# Patient Record
Sex: Male | Born: 2000 | Race: White | Hispanic: No | Marital: Single
Health system: Midwestern US, Community
[De-identification: ages and names within clinical notes are randomized; demographics above are authoritative.]

## PROBLEM LIST (undated history)

## (undated) DIAGNOSIS — J939 Pneumothorax, unspecified: Secondary | ICD-10-CM

---
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
## 2020-10-23 ENCOUNTER — Other Ambulatory Visit: Payer: Self-pay

## 2020-10-23 ENCOUNTER — Emergency Department (HOSPITAL_COMMUNITY)
Admission: EM | Admit: 2020-10-23 | Discharge: 2020-10-23 | Disposition: A | Discharge status: Home / Self Care | Payer: BC Managed Care – PPO | Attending: Emergency Medicine | Admitting: Emergency Medicine

## 2020-10-23 ENCOUNTER — Encounter (HOSPITAL_COMMUNITY): Payer: Self-pay | Admitting: Emergency Medicine

## 2020-10-23 ENCOUNTER — Emergency Department (HOSPITAL_COMMUNITY): Payer: BC Managed Care – PPO

## 2020-10-23 DIAGNOSIS — M25571 Pain in right ankle and joints of right foot: Secondary | ICD-10-CM | POA: Insufficient documentation

## 2020-10-23 MED ORDER — OXYCODONE-ACETAMINOPHEN 5-325 MG PO TABS
1.0000 | ORAL_TABLET | Freq: Once | ORAL | Status: AC
  Administered 2020-10-23: 1 via ORAL
  Filled 2020-10-23: qty 1

## 2020-10-23 MED ORDER — IBUPROFEN 400 MG PO TABS
600.0000 mg | ORAL_TABLET | Freq: Once | ORAL | Status: AC
  Administered 2020-10-23: 600 mg via ORAL
  Filled 2020-10-23: qty 1

## 2020-10-23 NOTE — Progress Notes (Signed)
Orthopedic Tech Progress Note Patient Details:  Jerry Perez 2001/11/17 [id number]  Ortho Devices Type of Ortho Device: Ankle Air splint, Crutches, Ace wrap Ortho Device/Splint Location: Right Ankle Ortho Device/Splint Interventions: Application, Adjustment   Post Interventions Patient Tolerated: Well Instructions Provided: Adjustment of device, Poper ambulation with device   Jerry Perez Jerry Perez Jerry Perez 10/23/2020, 9:32 PM

## 2020-10-23 NOTE — ED Triage Notes (Signed)
Patient injured his right ankle while playing soccer this evening , presents with mild swelling /skin intact.

## 2020-10-23 NOTE — Discharge Instructions (Addendum)
Please take Ibuprofen (Advil, motrin) and Tylenol (acetaminophen) to relieve your pain.  You may take up to 600 MG (3 pills) of normal strength ibuprofen every 8 hours as needed.  In between doses of ibuprofen you make take tylenol, up to 1,000 mg (two extra strength pills).  Do not take more than 3,000 mg tylenol in a 24 hour period.  Please check all medication labels as many medications such as pain and cold medications may contain tylenol.  Do not drink alcohol while taking these medications.  Do not take other NSAID'S while taking ibuprofen (such as aleve or naproxen).  Please take ibuprofen with food to decrease stomach upset.  Today you received medications that may make you sleepy or impair your ability to make decisions.  For the next 24 hours please do not drive, operate heavy machinery, care for a small child with out another adult present, or perform any activities that may cause harm to you or someone else if you were to fall asleep or be impaired.   Please wear the splint, Ace wrap, and use the crutches as needed for pain and comfort.  If your symptoms are not significantly improved in 1 week I would recommend following up with orthopedics.  We discussed ice, elevation and compression with an Ace wrap or the best way to help decrease the swelling which causes the heartbeat/throbbing type pain.

## 2020-10-23 NOTE — ED Provider Notes (Signed)
[facility] EMERGENCY DEPARTMENT Provider Note   CSN: [id number] Arrival date & time: 10/23/20  2013     History Chief Complaint  Patient presents with  . Ankle Injury    Jerry Perez is a 19 y.o. male with no pertinent past medical history presents today for evaluation of pain in his right ankle.  He was playing soccer when he ended up having a mechanical nonsyncopal fall injuring his right ankle.  He denies any injuries other than his right ankle.  No numbness or tingling.  He has been unable to bear weight since secondary to pain.  His pain is made worse when he attempts to bear weight or move it.  Made better with being still.  No meds prior to arrival.  No wounds.  He did not strike his head. HPI     History reviewed. No pertinent past medical history.  There are no problems to display for this patient.   History reviewed. No pertinent surgical history.     No family history on file.  Social History   Tobacco Use  . Smoking status: Never Smoker  . Smokeless tobacco: Never Used  Substance Use Topics  . Alcohol use: Never  . Drug use: Never    Home Medications Prior to Admission medications   Medication Sig Start Date End Date Taking? Authorizing Provider  cyclobenzaprine (FLEXERIL) 10 MG tablet Take 1 tablet (10 mg total) by mouth every 8 (eight) hours as needed for muscle spasms. Patient not taking: Reported on 10/23/2020 06/03/15   Joni Reining, PA-C  traMADol (ULTRAM) 50 MG tablet Take 1 tablet (50 mg total) by mouth every 6 (six) hours as needed for moderate pain. Patient not taking: Reported on 10/23/2020 06/03/15   Joni Reining, PA-C    Allergies    Patient has no known allergies.  Review of Systems   Review of Systems  Musculoskeletal:       Right ankle pain  Skin: Negative for color change, rash and wound.  Neurological: Negative for weakness, numbness and headaches.  All other systems reviewed and are  negative.   Physical Exam Updated Vital Signs BP (!) 138/58 (BP Location: Right Arm)   Pulse 65   Temp (!) 97.5 F (36.4 C) (Oral)   Resp 17   Ht 5\' 7"  (1.702 m)   Wt 80 kg   SpO2 96%   BMI 27.62 kg/m   Physical Exam Vitals and nursing note reviewed.  Constitutional:      General: He is not in acute distress. HENT:     Head: Normocephalic and atraumatic.  Cardiovascular:     Rate and Rhythm: Normal rate.     Pulses: Normal pulses.     Comments: 2+ right DP pulse, unable to palpate PT pulse secondary to pain.  Right foot is warm, well perfused with brisk capillary refill.  Pulmonary:     Effort: Pulmonary effort is normal. No respiratory distress.  Musculoskeletal:     Cervical back: No rigidity.     Comments: RLE: Mild edema to the medial and lateral right ankle.  Primarily over the malleoli.  There is tenderness to palpation worse near the medial malleoli than the lateral malleoli.  There is no significant tenderness to palpation, crepitus, or deformity of the distal foot or proximal right lower leg.  No pain in right knee.   ROM of right ankle limited due to pain.  AROM of toes on right foot.   Skin:  Comments: No skin breaks or tears over the right ankle.   Neurological:     Mental Status: He is alert. Mental status is at baseline.     Sensory: No sensory deficit.     Comments: Awake and alert, answers all questions appropriately.  Speech is not slurred.    Psychiatric:        Mood and Affect: Mood normal.     ED Results / Procedures / Treatments   Labs (all labs ordered are listed, but only abnormal results are displayed) Labs Reviewed - No data to display  EKG None  Radiology DG Ankle Complete Right  Result Date: 10/23/2020 CLINICAL DATA:  19 year old male with swelling status post soccer injury. EXAM: RIGHT ANKLE - COMPLETE 3+ VIEW COMPARISON:  None. FINDINGS: Right ankle joint effusion is evident on the lateral view. Skeletally mature. Bone  mineralization is within normal limits. Preserved mortise joint alignment. Talar dome intact. Distal tibia, fibula, and calcaneus appear intact. Other visible bones of the right foot appear intact. IMPRESSION: Right ankle joint effusion with no acute fracture or dislocation identified. Electronically Signed   By: Odessa Fleming M.D.   On: 10/23/2020 20:31    Procedures Procedures (including critical care time)  Medications Ordered in ED Medications  ibuprofen (ADVIL) tablet 600 mg (has no administration in time range)  oxyCODONE-acetaminophen (PERCOCET/ROXICET) 5-325 MG per tablet 1 tablet (has no administration in time range)    ED Course  I have reviewed the triage vital signs and the nursing notes.  Pertinent labs & imaging results that were available during my care of the patient were reviewed by me and considered in my medical decision making (see chart for details).    MDM Rules/Calculators/A&P                         Patient is a 19 year old man who presents today for evaluation of a mechanical injury to his right ankle while playing soccer.  No other injuries.  X-rays were obtained of the right ankle without fracture, dislocation/subluxation or other acute abnormalities.  On exam he is neurovascularly intact, range of motion is limited secondary to pain.  No obvious skin breaks or tears.  Recommended rice, conservative care.  He is given an Ace wrap and Aircast.  We discussed the importance of elevation, compression, and ice and controlling pain.  Work and school note are given.  While in the emergency room his blood pressure was slightly elevated, suspect this is secondary to pain.  His pain was treated with a one-time dose of Percocet while here along with a dose of ibuprofen.  Recommended outpatient orthopedics follow-up.  Return precautions were discussed with patient who states their understanding.  At the time of discharge patient denied any unaddressed complaints or concerns.  Patient  is agreeable for discharge home.  Note: Portions of this report may have been transcribed using voice recognition software. Every effort was made to ensure accuracy; however, inadvertent computerized transcription errors may be present  Final Clinical Impression(s) / ED Diagnoses Final diagnoses:  Acute right ankle pain    Rx / DC Orders ED Discharge Orders    None       Norman Clay 10/23/20 2122    Charlynne Pander, MD 10/24/20 1451

## 2021-02-22 ENCOUNTER — Emergency Department: Admit: 2021-02-23 | Payer: BLUE CROSS/BLUE SHIELD

## 2021-02-22 DIAGNOSIS — J9311 Primary spontaneous pneumothorax: Secondary | ICD-10-CM

## 2021-02-22 NOTE — ED Provider Notes (Addendum)
[place] [facility] ED  EMERGENCY DEPARTMENT ENCOUNTER   ATTENDING ATTESTATION     Pt Name: Jack Harrington  MRN: [id number]  Birthdate [birth year]  Date of evaluation: 02/23/21       Jack Harrington is a 20 y.o. male who presents with Chest Pain and Cough      MDM:     20 year old male presents with complaints of cough and shortness of breath.  Patient's chest x-ray showed evidence of a moderate sized right-sided pneumothorax.  Both myself and the midlevel provider had a conversation with the patient, at this time the patient is refusing to have a chest tube placed, plan will be for admission to the hospital service for observation, nonrebreather, patient states that he would possibly agree to the chest tube tomorrow if his pneumothorax has not improved significantly.    2:06 AM EDT  And consented to the chest tube procedure.        Chest Tube    Date/Time: 02/23/2021 2:06 AM  Performed by: Wyvonne Lenz, MD  Authorized by: Arnold Long, MD     Consent:     Consent obtained:  Written    Consent given by:  Patient    Risks discussed:  Bleeding, incomplete drainage and infection    Alternatives discussed:  No treatment  Pre-procedure details:     Skin preparation:  ChloraPrep    Preparation: Patient was prepped and draped in the usual sterile fashion    Sedation:     Sedation type:  Anxiolysis  Anesthesia (see MAR for exact dosages):     Anesthesia method:  Local infiltration    Local anesthetic:  Lidocaine 1% w/o epi  Procedure details:     Placement location:  R lateral    Scalpel size:  11    Tube size (Fr):  8    Ultrasound guidance: no      Tension pneumothorax: no      Tube connected to:  Suction    Drainage characteristics:  Air only    Suture material:  0 silk    Dressing:  4x4 sterile gauze, petrolatum-impregnated gauze and Xeroform gauze  Post-procedure details:     Post-insertion x-ray findings: tube in good position      Patient tolerance of procedure:  Tolerated well, no immediate complications          Vitals:    Vitals:    02/22/21 2156 02/22/21 2258 02/23/21 0001   BP: (!) 126/92     Pulse: 84     Resp: 16     Temp: 99 ??F (37.2 ??C) 101.8 ??F (38.8 ??C) 98.2 ??F (36.8 ??C)   TempSrc:   Oral   SpO2: 100%     Weight: 135 lb 1.6 oz (61.3 kg)     Height: 6' (1.829 m)           This visit was performed by both a physician and an APC. I personally evaluated and examined the patient. I performed all aspects of the MDM as documented     Jettie Pagan, MD  Attending Emergency  Physician                  Wyvonne Lenz, MD  02/23/21 0003       Wyvonne Lenz, MD  02/23/21 3086

## 2021-02-22 NOTE — ED Provider Notes (Signed)
[place] [facility] ED  eMERGENCY dEPARTMENT eNCOUnter      Pt Name: Jack Harrington  MRN: [id number]  Birthdate Aug 12, 2001  Date of evaluation: 02/22/21      CHIEF COMPLAINT       Chief Complaint   Patient presents with   ??? Chest Pain   ??? Cough         HISTORY OF PRESENT ILLNESS    Jack Harrington is a 20 y.o. male who presents complaining of chest pain and cough started today  The history is provided by the patient.   Cough  Cough characteristics:  Dry  Severity:  Moderate  Onset quality:  Sudden  Duration:  1 day  Timing:  Intermittent  Progression:  Waxing and waning  Chronicity:  New  Smoker: yes    Relieved by:  Nothing  Worsened by:  Nothing  Ineffective treatments:  None tried  Associated symptoms: chest pain, fever and shortness of breath        REVIEW OF SYSTEMS       Review of Systems   Constitutional: Positive for fever.   Respiratory: Positive for cough and shortness of breath.    Cardiovascular: Positive for chest pain.   All other systems reviewed and are negative.      PAST MEDICAL HISTORY   History reviewed. No pertinent past medical history.    SURGICAL HISTORY     History reviewed. No pertinent surgical history.    CURRENT MEDICATIONS       Previous Medications    No medications on file       ALLERGIES     has No Known Allergies.    FAMILY HISTORY     has no family status information on file.        SOCIAL HISTORY      reports that he has been smoking e-cigarettes. He has never used smokeless tobacco. He reports current alcohol use. He reports previous drug use. Drug: Marijuana Sheran Fava).    PHYSICAL EXAM     INITIAL VITALS: BP (!) 126/92    Pulse 98    Temp 98.2 ??F (36.8 ??C) (Oral)    Resp 17    Ht 6' (1.829 m)    Wt 135 lb 1.6 oz (61.3 kg)    SpO2 99%    BMI 18.32 kg/m??      Physical Exam  Vitals and nursing note reviewed.   Constitutional:       Appearance: He is well-developed.   HENT:      Head: Normocephalic and atraumatic.   Eyes:      Pupils: Pupils are equal, round, and reactive to light.   Cardiovascular:       Rate and Rhythm: Normal rate and regular rhythm.      Heart sounds: Normal heart sounds. No murmur heard.      Pulmonary:      Effort: Pulmonary effort is normal.      Breath sounds: Normal breath sounds.   Abdominal:      General: Bowel sounds are normal.      Palpations: Abdomen is soft.      Tenderness: There is no abdominal tenderness.   Musculoskeletal:         General: Normal range of motion.      Cervical back: Normal range of motion.   Skin:     General: Skin is warm and dry.   Neurological:      Mental Status: He is alert and oriented to  person, place, and time.         MEDICAL DECISION MAKING:     35% pneumothorax on right.  Patient refuses chest tube while here in the emergency department.  I did speak with the internal medicine team and they agreed to admit him.  I spoke with Dr. Iline Oven and he asked to speak with the patient.  He is speaking with the patient at this time.  We are awaiting bed number.  Patient's O2 sat is 99% pulse is 98 respirations 17 nonlabored he is in no distress.    DIAGNOSTIC RESULTS     EKG: All EKG's are interpreted by the Emergency Department Physician who either signs or Co-signs this chart in the absence of acardiologist.        RADIOLOGY:[place] film, CT, MRI, and formal ultrasound images (except ED bedside ultrasound) are read by the radiologist and the images and interpretations are directly viewed by the emergency physician.           LABS:All lab results were reviewed by myself, and all abnormals are listed below.  Labs Reviewed   STREP SCREEN GROUP A THROAT   COVID-19, RAPID   RAPID INFLUENZA A/B ANTIGENS   CBC WITH AUTO DIFFERENTIAL   BASIC METABOLIC PANEL W/ REFLEX TO MG FOR LOW K   TROPONIN   D-DIMER, QUANTITATIVE   LACTATE, SEPSIS         EMERGENCY DEPARTMENT COURSE:   Vitals:    Vitals:    02/22/21 2156 02/22/21 2258 02/23/21 0001 02/23/21 0011   BP: (!) 126/92      Pulse: 84   98   Resp: 16   17   Temp: 99 ??F (37.2 ??C) 101.8 ??F (38.8 ??C) 98.2 ??F (36.8 ??C)     TempSrc:   Oral    SpO2: 100%   99%   Weight: 135 lb 1.6 oz (61.3 kg)      Height: 6' (1.829 m)          The patient was given the following medications while in the emergency department:  Orders Placed This Encounter   Medications   ??? 0.9 % sodium chloride bolus   ??? ketorolac (TORADOL) injection 30 mg   ??? acetaminophen (TYLENOL) tablet 1,000 mg       -------------------------      CRITICAL CARE:    CONSULTS:  IP CONSULT TO INTERNAL MEDICINE  IP CONSULT TO PULMONOLOGY    PROCEDURES:  Procedures    FINAL IMPRESSION      1. Primary spontaneous pneumothorax          DISPOSITION/PLAN   DISPOSITION Admitted 02/23/2021 12:19:04 AM      PATIENT REFERREDTO:  No follow-up provider specified.    DISCHARGEMEDICATIONS:  New Prescriptions    No medications on file       (Please note that portions of this note were completed with a voice recognition program.  Efforts were made to edit thedictations but occasionally words are mis-transcribed.)    Karilyn Cota, PA-C                        Karilyn Cota, PA-C  02/23/21 [phone]

## 2021-02-22 NOTE — ED Notes (Signed)
Pt presents to the ER for SOB, cough, runny nose. Pt states he has had a runny nose since this morning with a cough. Pt states he developed severe constant stabbing chest pain around noon and SOB with coughing. Laying on cot pt states pain is 4/10, moving around pt states pain is 10/10. Pt is 100% on room air.      Alphia Moh, RN  02/22/21 2351

## 2021-02-23 ENCOUNTER — Inpatient Hospital Stay: Admit: 2021-02-23 | Payer: BLUE CROSS/BLUE SHIELD

## 2021-02-23 ENCOUNTER — Inpatient Hospital Stay
Admit: 2021-02-23 | Discharge: 2021-03-04 | Disposition: A | Discharge status: Home / Self Care | Payer: BLUE CROSS/BLUE SHIELD | Admitting: Family Medicine

## 2021-02-23 LAB — CBC WITH AUTO DIFFERENTIAL
Absolute Eos #: 0 10*3/uL (ref 0.0–0.4)
Absolute Immature Granulocyte: 0 10*3/uL (ref 0.00–0.30)
Absolute Lymph #: 0.71 10*3/uL — ABNORMAL LOW (ref 1.2–5.2)
Absolute Mono #: 0.61 10*3/uL (ref 0.2–0.8)
Basophils Absolute: 0.3 10*3/uL — ABNORMAL HIGH (ref 0.0–0.2)
Basophils: 3 %
Eosinophils %: 0 % — ABNORMAL LOW (ref 1–4)
Hematocrit: 45 % (ref 40.7–50.3)
Hemoglobin: 15.4 g/dL (ref 13.0–17.0)
Immature Granulocytes: 0 %
Lymphocytes: 7 % — ABNORMAL LOW (ref 25–45)
MCH: 29.7 pg (ref 25.2–33.5)
MCHC: 34.2 g/dL (ref 28.4–34.8)
MCV: 86.7 fL (ref 82.6–102.9)
MPV: 11.1 fL (ref 8.1–13.5)
Monocytes: 6 % (ref 2–8)
NRBC Automated: 0 per 100 WBC
Platelets: 205 10*3/uL (ref 138–453)
RBC: 5.19 m/uL (ref 4.21–5.77)
RDW: 12.4 % (ref 11.8–14.4)
Seg Neutrophils: 84 % — ABNORMAL HIGH (ref 34–64)
Segs Absolute: 8.48 10*3/uL — ABNORMAL HIGH (ref 1.8–8.0)
WBC: 10.1 10*3/uL (ref 4.5–13.5)

## 2021-02-23 LAB — LACTATE, SEPSIS: Lactic Acid, Sepsis: 1.4 mmol/L (ref 0.5–1.9)

## 2021-02-23 LAB — BASIC METABOLIC PANEL W/ REFLEX TO MG FOR LOW K
Anion Gap: 11 mmol/L (ref 9–17)
BUN: 13 mg/dL (ref 6–20)
Bun/Cre Ratio: 16 (ref 9–20)
CO2: 25 mmol/L (ref 20–31)
Calcium: 9.6 mg/dL (ref 8.6–10.4)
Chloride: 101 mmol/L (ref 98–107)
Creatinine: 0.81 mg/dL (ref 0.70–1.20)
GFR African American: >60 mL/min (ref >60)
GFR Non-African American: >60 mL/min (ref >60)
Glucose: 96 mg/dL (ref 70–99)
Potassium: 4.1 mmol/L (ref 3.7–5.3)
Sodium: 137 mmol/L (ref 135–144)

## 2021-02-23 LAB — STREP SCREEN GROUP A THROAT

## 2021-02-23 LAB — D-DIMER, QUANTITATIVE: D-Dimer, Quant: 0.39 mg/L FEU (ref 0.00–0.59)

## 2021-02-23 LAB — COVID-19, RAPID: SARS-CoV-2, Rapid: NOT DETECTED

## 2021-02-23 LAB — RAPID INFLUENZA A/B ANTIGENS

## 2021-02-23 LAB — TROPONIN: Troponin, High Sensitivity: <6 ng/L (ref 0–22)

## 2021-02-23 MED ORDER — ACETAMINOPHEN 500 MG PO TABS
500 MG | Freq: Once | ORAL | Status: AC
Start: 2021-02-23 — End: 2021-02-22
  Administered 2021-02-23: 04:00:00 via ORAL

## 2021-02-23 MED ORDER — ALBUTEROL SULFATE (2.5 MG/3ML) 0.083% IN NEBU
RESPIRATORY_TRACT | Status: DC | PRN
Start: 2021-02-23 — End: 2021-03-04

## 2021-02-23 MED ORDER — DEXTROSE 5 % IV SOLN (MINI-BAG)
5 % | INTRAVENOUS | Status: DC
Start: 2021-02-23 — End: 2021-02-23
  Administered 2021-02-23: 08:00:00 via INTRAVENOUS

## 2021-02-23 MED ORDER — NORMAL SALINE FLUSH 0.9 % IV SOLN
0.9 | Freq: Two times a day (BID) | INTRAVENOUS | Status: DC
Start: 2021-02-23 — End: 2021-03-04
  Administered 2021-02-24 – 2021-03-04 (×17): via INTRAVENOUS

## 2021-02-23 MED ORDER — SODIUM CHLORIDE 0.9 % IV BOLUS
0.9 % | Freq: Once | INTRAVENOUS | Status: AC
Start: 2021-02-23 — End: 2021-02-23
  Administered 2021-02-23: 04:00:00 via INTRAVENOUS

## 2021-02-23 MED ORDER — ACETAMINOPHEN 650 MG RE SUPP
650 MG | Freq: Four times a day (QID) | RECTAL | Status: DC | PRN
Start: 2021-02-23 — End: 2021-03-04

## 2021-02-23 MED ORDER — IPRATROPIUM-ALBUTEROL 0.5-2.5 (3) MG/3ML IN SOLN
RESPIRATORY_TRACT | Status: DC | PRN
Start: 2021-02-23 — End: 2021-03-04

## 2021-02-23 MED ORDER — AZITHROMYCIN 250 MG PO TABS
250 MG | Freq: Every day | ORAL | Status: AC
Start: 2021-02-23 — End: 2021-02-25
  Administered 2021-02-24 – 2021-02-25 (×2): via ORAL

## 2021-02-23 MED ORDER — POLYETHYLENE GLYCOL 3350 17 G PO PACK
17 g | Freq: Every day | ORAL | Status: DC | PRN
Start: 2021-02-23 — End: 2021-03-04

## 2021-02-23 MED ORDER — FENTANYL CITRATE (PF) 100 MCG/2ML IJ SOLN
100 MCG/2ML | Freq: Once | INTRAMUSCULAR | Status: AC
Start: 2021-02-23 — End: 2021-02-23
  Administered 2021-02-23: 06:00:00 via INTRAVENOUS

## 2021-02-23 MED ORDER — ONDANSETRON HCL 4 MG/2ML IJ SOLN
4 MG/2ML | Freq: Four times a day (QID) | INTRAMUSCULAR | Status: DC | PRN
Start: 2021-02-23 — End: 2021-03-04

## 2021-02-23 MED ORDER — DEXTROSE 5 % IV SOLN (MINI-BAG)
5 | INTRAVENOUS | Status: DC
Start: 2021-02-23 — End: 2021-02-23
  Administered 2021-02-23: 07:00:00 via INTRAVENOUS

## 2021-02-23 MED ORDER — ENOXAPARIN SODIUM 40 MG/0.4ML SC SOLN
40 | Freq: Every day | SUBCUTANEOUS | Status: DC
Start: 2021-02-23 — End: 2021-03-04
  Administered 2021-02-23 – 2021-02-24 (×2): via SUBCUTANEOUS

## 2021-02-23 MED ORDER — SODIUM CHLORIDE 0.9 % IV SOLN
0.9 | INTRAVENOUS | Status: DC
Start: 2021-02-23 — End: 2021-02-23
  Administered 2021-02-23: 07:00:00 via INTRAVENOUS

## 2021-02-23 MED ORDER — LORAZEPAM 2 MG/ML IJ SOLN
2 MG/ML | Freq: Once | INTRAMUSCULAR | Status: AC
Start: 2021-02-23 — End: 2021-02-23
  Administered 2021-02-23: 06:00:00 via INTRAVENOUS

## 2021-02-23 MED ORDER — SODIUM CHLORIDE 0.9 % IV SOLN
0.9 % | INTRAVENOUS | Status: DC | PRN
Start: 2021-02-23 — End: 2021-03-04

## 2021-02-23 MED ORDER — IPRATROPIUM-ALBUTEROL 0.5-2.5 (3) MG/3ML IN SOLN
RESPIRATORY_TRACT | Status: DC
Start: 2021-02-23 — End: 2021-02-23

## 2021-02-23 MED ORDER — ONDANSETRON 4 MG PO TBDP
4 MG | Freq: Three times a day (TID) | ORAL | Status: DC | PRN
Start: 2021-02-23 — End: 2021-03-04

## 2021-02-23 MED ORDER — KETOROLAC TROMETHAMINE 30 MG/ML IJ SOLN
30 MG/ML | Freq: Once | INTRAMUSCULAR | Status: AC
Start: 2021-02-23 — End: 2021-02-22
  Administered 2021-02-23: 04:00:00 via INTRAVENOUS

## 2021-02-23 MED ORDER — LIDOCAINE HCL 1 % IJ SOLN
1 % | Freq: Once | INTRAMUSCULAR | Status: AC
Start: 2021-02-23 — End: 2021-02-23
  Administered 2021-02-23: 06:00:00 via INTRADERMAL

## 2021-02-23 MED ORDER — ACETAMINOPHEN 325 MG PO TABS
325 MG | Freq: Four times a day (QID) | ORAL | Status: DC | PRN
Start: 2021-02-23 — End: 2021-03-04
  Administered 2021-02-23 – 2021-02-26 (×2): 650 mg via ORAL

## 2021-02-23 MED ORDER — NORMAL SALINE FLUSH 0.9 % IV SOLN
0.9 | INTRAVENOUS | Status: DC | PRN
Start: 2021-02-23 — End: 2021-03-04

## 2021-02-23 MED FILL — ACETAMINOPHEN 325 MG PO TABS: 325 MG | ORAL | Qty: 2

## 2021-02-23 MED FILL — CEFTRIAXONE SODIUM 1 G IJ SOLR: 1 g | INTRAMUSCULAR | Qty: 1000

## 2021-02-23 MED FILL — ACETAMINOPHEN EXTRA STRENGTH 500 MG PO TABS: 500 MG | ORAL | Qty: 2

## 2021-02-23 MED FILL — LORAZEPAM 2 MG/ML IJ SOLN: 2 MG/ML | INTRAMUSCULAR | Qty: 1

## 2021-02-23 MED FILL — LOVENOX 40 MG/0.4ML SC SOLN: 40 MG/0.4ML | SUBCUTANEOUS | Qty: 0.4

## 2021-02-23 MED FILL — FENTANYL CITRATE (PF) 100 MCG/2ML IJ SOLN: 100 MCG/2ML | INTRAMUSCULAR | Qty: 2

## 2021-02-23 MED FILL — KETOROLAC TROMETHAMINE 30 MG/ML IJ SOLN: 30 MG/ML | INTRAMUSCULAR | Qty: 1

## 2021-02-23 MED FILL — AZITHROMYCIN 500 MG IV SOLR: 500 MG | INTRAVENOUS | Qty: 500

## 2021-02-23 MED FILL — XYLOCAINE 1 % IJ SOLN: 1 % | INTRAMUSCULAR | Qty: 20

## 2021-02-23 NOTE — Progress Notes (Addendum)
Patient admitted from ED, after chest tube placed by Dr. Holley Dexter, via stretcher to Progressive Care Unit, room 1004.  Applied chest tube to -20 cm to continuous water suction.  Attempted to complete admission database, as patient continues to fall asleep.  Diminished, but clear lung sounds in right lobes, & clear lung sounds in left lobes.  Vital signs taken.  Heart monitor applied.  Bed alarm activated & side rails, x2 raised for patient safety.  Will monitor.

## 2021-02-23 NOTE — Other (Signed)
RT Inhaler-Nebulizer Bronchodilator Protocol Note    There is a bronchodilator order in the chart from a provider indicating to follow the RT Bronchodilator Protocol and there is an ???Initiate RT Inhaler-Nebulizer Bronchodilator Protocol??? order as well (see protocol at bottom of note).    CXR Findings:  XR CHEST PORTABLE    Addendum Date: 02/22/2021    ADDENDUM: This will assist with Dr. Cheri Fowler PA at 11:33 p.m.     Result Date: 02/22/2021  Moderate right pneumothorax       The findings from the last RT Protocol Assessment were as follows:   History Pulmonary Disease: None or smoker <15 pack years  Respiratory Pattern: Regular pattern and RR 12-20 bpm  Breath Sounds: Clear breath sounds  Cough: Strong, spontaneous, non-productive  Indication for Bronchodilator Therapy: None  Bronchodilator Assessment Score: 0    Aerosolized bronchodilator medication orders have been revised according to the RT Inhaler-Nebulizer Bronchodilator Protocol below.    Respiratory Therapist to perform RT Therapy Protocol Assessment initially then follow the protocol.  Repeat RT Therapy Protocol Assessment PRN for score 0-3 or on second treatment, BID, and PRN for scores above 3.    No Indications - adjust the frequency to every 6 hours PRN wheezing or bronchospasm, if no treatments needed after 48 hours then discontinue using Per Protocol order mode.     If indication present, adjust the RT bronchodilator orders based on the Bronchodilator Assessment Score as indicated below.  Use Inhaler orders unless patient has one or more of the following: on home nebulizer, not able to hold breath for 10 seconds, is not alert and oriented, cannot activate and use MDI correctly, or respiratory rate 25 breaths per minute or more, then use the equivalent nebulizer order(s) with same Frequency and PRN reasons based on the score.  If a patient is on this medication at home then do not decrease Frequency below that used at home.    0-3 - enter or revise  RT bronchodilator order(s) to equivalent RT Bronchodilator order with Frequency of every 4 hours PRN for wheezing or increased work of breathing using Per Protocol order mode.        4-6 - enter or revise RT Bronchodilator order(s) to two equivalent RT bronchodilator orders with one order with BID Frequency and one order with Frequency of every 4 hours PRN wheezing or increased work of breathing using Per Protocol order mode.        7-10 - enter or revise RT Bronchodilator order(s) to two equivalent RT bronchodilator orders with one order with TID Frequency and one order with Frequency of every 4 hours PRN wheezing or increased work of breathing using Per Protocol order mode.       11-13 - enter or revise RT Bronchodilator order(s) to one equivalent RT bronchodilator order with QID Frequency and an Albuterol order with Frequency of every 4 hours PRN wheezing or increased work of breathing using Per Protocol order mode.      Greater than 13 - enter or revise RT Bronchodilator order(s) to one equivalent RT bronchodilator order with every 4 hours Frequency and an Albuterol order with Frequency of every 2 hours PRN wheezing or increased work of breathing using Per Protocol order mode.     RT to enter RT Home Evaluation for COPD & MDI Assessment order using Per Protocol order mode.    Electronically signed by Enrique Sack, RCP on 02/23/2021 at 3:04 AM

## 2021-02-23 NOTE — ED Notes (Signed)
Pt refusing chest tube at this time. Dr. Holley Dexter at bedside. Pt okay with being admitted.      Alphia Moh, RN  02/23/21 0001

## 2021-02-23 NOTE — ED Notes (Signed)
Nonrebreather applied to pt at [facility], RN  02/23/21 0011

## 2021-02-23 NOTE — Consults (Signed)
[facility]  Consultation Note                             Patient's Name/Date of Birth: Chief [name] / [birth year] (20 y.o.)    Date: February 23, 2021     Chief Complaint:   Chief Complaint   Patient presents with   ??? Chest Pain   ??? Cough       Surgeon: Dr. Suzanna Obey   PCP: None on file      Admitting Diagnosis: Pneumothorax  Reason for consultation: Spontaneous pneumothorax    HISTORY OF PRESENT ILLNESS:  Jack Harrington is a 20 y.o. year old, male who presented to the emergency room with complaints of chest pain and cough. Upon evaluation it was noted that the patient had a moderate sized left pneumothorax and a chest tube was placed. The patient has no significant past medical history. He does vape and smoke marijuana occasionally.     Review of systems:  Review of Systems   Constitutional: Negative for activity change, fatigue and fever.   Respiratory: Positive for cough and shortness of breath. Negative for chest tightness.    Cardiovascular: Positive for chest pain. Negative for palpitations and leg swelling.   Gastrointestinal: Negative for constipation, diarrhea, nausea and vomiting.   Endocrine: Negative for cold intolerance and polyuria.   Genitourinary: Negative for difficulty urinating.   Skin: Negative for wound.   Neurological: Negative for dizziness and numbness.   Psychiatric/Behavioral: Negative for confusion.     Past Medical History:    History reviewed. No pertinent past medical history.    Past Surgical History:    History reviewed. No pertinent surgical history.    Medications:   ??? sodium chloride flush  5-40 mL IntraVENous 2 times per day   ??? enoxaparin  40 mg SubCUTAneous Daily   ??? cefTRIAXone (ROCEPHIN) IV  1,000 mg IntraVENous Q24H   ??? azithromycin  500 mg IntraVENous Q24H       Allergies:  Patient has no known allergies.    Social History:       Social History     Tobacco Use   Smoking Status Current Every Day Smoker   ??? Types: E-Cigarettes   Smokeless Tobacco Never  Used        Social History     Substance and Sexual Activity   Alcohol Use Yes        Social History     Substance and Sexual Activity   Drug Use Not Currently   ??? Types: Marijuana Sheran Fava)      Marital status:  single    Occupation:  Employed as a Education administrator    Family History:    History reviewed. No pertinent family history.    PHYSICAL EXAM:  Physical Exam  Vitals and nursing note reviewed.   Constitutional:       General: He is not in acute distress.     Appearance: Normal appearance.   HENT:      Head: Normocephalic.   Eyes:      Pupils: Pupils are equal, round, and reactive to light.   Cardiovascular:      Rate and Rhythm: Normal rate and regular rhythm.      Pulses: Normal pulses.      Heart sounds: Normal heart sounds. No murmur heard.  No friction rub. No gallop.    Pulmonary:      Effort: Pulmonary effort is  normal. No respiratory distress.      Breath sounds: Normal breath sounds.   Abdominal:      General: Bowel sounds are normal. There is no distension.      Palpations: Abdomen is soft.      Tenderness: There is no abdominal tenderness.   Musculoskeletal:         General: Normal range of motion.      Cervical back: Normal range of motion.   Skin:     General: Skin is warm and dry.      Capillary Refill: Capillary refill takes less than 2 seconds.   Neurological:      General: No focal deficit present.      Mental Status: He is alert and oriented to person, place, and time.   Psychiatric:         Mood and Affect: Mood normal.         Behavior: Behavior normal.         Thought Content: Thought content normal.         Judgment: Judgment normal.     Data:  CBC with Differential:    Lab Results   Component Value Date    WBC 10.1 02/22/2021    RBC 5.19 02/22/2021    HGB 15.4 02/22/2021    HCT 45.0 02/22/2021    PLT 205 02/22/2021    MCV 86.7 02/22/2021    MCH 29.7 02/22/2021    MCHC 34.2 02/22/2021    RDW 12.4 02/22/2021    LYMPHOPCT 7 02/22/2021    MONOPCT 6 02/22/2021    BASOPCT 3 02/22/2021    MONOSABS 0.61  02/22/2021    LYMPHSABS 0.71 02/22/2021    EOSABS 0.00 02/22/2021    BASOSABS 0.30 02/22/2021     CMP:    Lab Results   Component Value Date    NA 137 02/22/2021    K 4.1 02/22/2021    CL 101 02/22/2021    CO2 25 02/22/2021    BUN 13 02/22/2021    CREATININE 0.81 02/22/2021    GFRAA >60 02/22/2021    LABGLOM >60 02/22/2021    GLUCOSE 96 02/22/2021    CALCIUM 9.6 02/22/2021     PT/INR:  No results found for: PROTIME, INR    Imaging Studies:    CXR:   FINDINGS:   The cardiac silhouette and mediastinal contours are normal. ??The lungs are   clear. ??There is a right chest tube. ??The right pneumothorax has resolved.   The visualized osseous structures are unremarkable.     Assessment & Plan:  Problem List:  Patient Active Problem List   Diagnosis   ??? Pneumothorax     Plan:  Keep chest tube to suction today. Will evaluate placing the chest tube to water seal tomorrow.     The above recommendations including medications and orders were discussed and agreed upon with Dr. Suzanna Obey the attending on service for the cardiothoracic surgery group today.     On this date 02/23/2021 I have spent 40 minutes reviewing previous notes, test results and face to face with the patient discussing the diagnosis and importance of compliance with the treatment plan as well as documenting on the day of the visit. At least 50% of the time documented was spent with the patient to provide counseling and/or coordination of care.    Anne Shutter, APRN - CNP on 02/23/2021 at 9:47 AM  Phone: [phone]

## 2021-02-23 NOTE — Plan of Care (Signed)
Problem: Falls - Risk of:  Goal: Will remain free from falls  Description: Will remain free from falls.Fall risk assessment completed. Patient instructed to use call light. Bed locked and in lowest position, side rails up 2/4, call light and bedside table within reach, clutter removed, and non-skid footwear on when pt out of bed. Hourly rounds will continue.   02/23/2021 1818 by Ileene Patrick, RN  Outcome: Ongoing     Problem: Breathing Pattern - Ineffective:  Goal: Ability to achieve and maintain a regular respiratory rate will improve  Description: Ability to achieve and maintain a regular respiratory rate will improve  02/23/2021 1818 by Ileene Patrick, RN  Outcome: Ongoing

## 2021-02-23 NOTE — Progress Notes (Signed)
Transitions of Care Pharmacy Service   Medication Review      The patient does not currently take any prescription or OTC maintenance medications at home. No changes to the patient's home medication list were necessary.     Source(s) of information: patient     Please review the ACTION REQUESTED section of this note below for any discrepancies on current hospital orders.      I have made the following changes to the patient's home medication list:  Discontinued None         Please feel free to call me with any questions about this encounter. Thank you.    This note will be reviewed and co-signed by the Transitions of Care Pharmacist.    Simonne Martinet, PharmD student  Transitions of Care Pharmacy Service  Phone:  [phone]  Fax: [phone]      Electronically signed by Simonne Martinet on 02/23/2021 at 11:41 AM

## 2021-02-23 NOTE — ED Notes (Signed)
Chest tube inserted by Dr. Holley Dexter. Pt states easier to breath.      Alphia Moh, RN  02/23/21 0210

## 2021-02-23 NOTE — Other (Signed)
[facility] CARE DEPARTMENT - [facility]  PROGRESS NOTE    Room # 1004/1004-02   Name: Calden Dorsey              Reason for visit: Routine.    I visited the patient and family.    Admit Date & Time: 02/22/2021 10:51 PM    Assessment:  Hadden Steig is a 20 y.o. male who reports being in the hospital because "chest pain." Upon entering the room patient was sitting up, with significant other bedside, eating [employer].       Intervention:  I introduced myself and my title as chaplain I offered space for patient and family to express feelings, needs, and concerns and provided a ministry presence. Patient engaged in conversation. Gave patient spiritual care business card and offered words of encouragement.     Outcome:  Patient expressed gratitude for visit.     Plan:  Chaplains will remain available to offer spiritual and emotional support as needed.  Electronically signed by Vernard Gambles, on 02/23/2021 at 1:28 PM.  Spiritual Care Department  [facility]       02/23/21 1327   Encounter Summary   Services provided to: Patient and family together   Referral/Consult From: Rounding   Support System Significant other   Continue Visiting   (02/23/21 )   Complexity of Encounter Low   Length of Encounter 15 minutes   Spiritual Assessment Completed Yes   Routine   Type Initial   Assessment Calm;Approachable;Coping   Intervention Art therapist of presence;Explored coping resources;Active listening;Nurtured hope   Outcome Expressed gratitude

## 2021-02-23 NOTE — ED Notes (Signed)
Pt states he does vape. States he was holding in his sneezes earlier today.      Alphia Moh, RN  02/23/21 [phone]

## 2021-02-23 NOTE — Progress Notes (Signed)
Occupational Therapy  [facility]  Occupational Therapy Not Seen Note    Patient not available for Occupational Therapy due to:    []  Testing:    []  Hemodialysis    []  Cancelled by RN:    [] Refusal by Patient:    []  Surgery:     []  Intubation:     []  Pain Medication:    []  Sedation:     []  Spine Precautions :    []  Medical Instability:    [x]  Other:  D/C OT; pt is independent    Althea Charon, OTR/L

## 2021-02-23 NOTE — H&P (Signed)
[facility]  Office: [phone]  Ardelle AntonJoel Retholtz, DO, Susy Frizzleharles Sheldon, DO, Freddi CheStanley Orlop, DO, Tinnie GensJeffrey Blood, DO, Salome ArntVirender Puri, MD, Lowry BowlMoriah Timko, MD, Arnold LongShowkat Ahmad, MD, Chad CordialShereen Louka, MD, Minus BreedingSneha Kommoori, MD, Seabron SpatesBrandon Wong, MD, Yolanda BonineWaseem Khan, MD, Silvio Claymanhristopher Hauger, DO, Juleen StarrEugene Hsu, DO, Ree ShayZainulabedin Waqar, MD,  Orion ModestMohammad Mashaleh, DO, Dinah BeersZayd Ahmed, MD, Jeanmarie Plantatika Dogra, MD, Tiney RougeJustin Mills, MD, Burman NievesMichael Retholtz, DO, Wilnette KalesZyad Chaudhary, MD, Durwin GlazeMaxim Zlatopolsky, MD, Carlena HurlShirley Waterhouse, CNP, [facility] LLCMalissa DelGrosso, CNP, Marylin CrosbyJanice Calfee, CNP, Reola Mosheramara Tobian, CNS, Erick AlleyJamie Hallock, CNP, Ival BiblePenny Graves, CNP, Dereck LigasLinda Ward, CNP, Luberta MutterAmber Moore, CNP, Henry Russelaniel Anwar Sakata, CNP, Luna GlasgowAndrew Jurgenson, PA-C, Bess KindsJulie Rowe, DNP, Minna AntisHolly Medlen, DNP, Farrel GordonMaura Winters, CNP, Glennie Hawkricia Yates, CNP, Ileene PatrickEdna Lump, CNP, Franklyn LorAshley Kent, CNP, Hulen Shoutshristine Evans, CNP, Rikki SpearingSarah Tutolo, CNP         [facility]   [facility]   [facility]    HISTORY AND PHYSICAL EXAMINATION            Date:   02/23/2021  Patient name:  Jack RiggsDylan Harrington  Date of admission:  02/22/2021 10:51 PM  MRN:   [id number]  Account:  [id number]  Date of Birth:  23-Jan-2001  PCP:    No primary care provider on file.  Room:   1004/1004-02  Code Status:    Full Code    Chief Complaint:     Chief Complaint   Patient presents with   ??? Chest Pain   ??? Cough       History Obtained From:     patient    History of Present Illness:     Jack RiggsDylan Harrington is a 20 y.o. Non-hispanic / non latino male who presents with Chest Pain and Cough   and is admitted to the hospital for the management of Primary spontaneous pneumothorax.    Patient reports that he was at work and had the sensation of a sneeze coming.  Patient held his sneeze by holding his breath and shortly thereafter he developed severe stabbing chest pain rated at an 8/10 with a cough and shortness of breath.  Patient reported to the emergency department for evaluation.  In the emergency department patient was found to have a moderate pneumothorax to the right.  Patient is a very  tall and thin white male putting him at an increased risk for spontaneous pneumothorax.  Patient also has a known history of e-cigarette use.  Emergency department placed a small caliber chest tube and did evacuate the pneumothorax.  During my exam today patient is resting comfortably and his only complaint is mild pain at the insertion site.  Patient is on wall suction and we have requested cardiothoracic to evaluate the patient as well.    Past Medical History:     History reviewed. No pertinent past medical history.     Past Surgical History:     History reviewed. No pertinent surgical history.     Medications Prior to Admission:     Prior to Admission medications    Not on File        Allergies:     Patient has no known allergies.    Social History:     Tobacco:    reports that he has been smoking e-cigarettes. He has never used smokeless tobacco.  Alcohol:      reports current alcohol use.  Drug Use:  reports previous drug use. Drug: Marijuana Sheran Fava(Weed).    Family History:     Family History   Problem Relation Age of Onset   ??? No  Known Problems Mother    ??? No Known Problems Father        Review of Systems:     Positive and Negative as described in HPI.    CONSTITUTIONAL:  negative for fevers, chills, sweats, fatigue, weight loss  HEENT:  negative for vision, hearing changes, runny nose, throat pain  RESPIRATORY:  negative for shortness of breath, cough, congestion, wheezing  CARDIOVASCULAR:  negative for chest pain, palpitations.  Very mild pain at the insertion site of the chest tube.  GASTROINTESTINAL:  negative for nausea, vomiting, diarrhea, constipation, change in bowel habits, abdominal pain   GENITOURINARY:  negative for difficulty of urination, burning with urination, frequency   INTEGUMENT:  negative for rash, skin lesions, easy bruising   HEMATOLOGIC/LYMPHATIC:  negative for swelling/edema   ALLERGIC/IMMUNOLOGIC:  negative for urticaria , itching  ENDOCRINE:  negative increase in drinking, increase in  urination, hot or cold intolerance  MUSCULOSKELETAL:  negative joint pains, muscle aches, swelling of joints  NEUROLOGICAL:  negative for headaches, dizziness, lightheadedness, numbness, pain, tingling extremities  BEHAVIOR/PSYCH:  negative for depression, anxiety    Physical Exam:   BP 116/72    Pulse 92    Temp 98.1 ??F (36.7 ??C) (Oral)    Resp 15    Ht 6' (1.829 m)    Wt 134 lb 14.4 oz (61.2 kg)    SpO2 97%    BMI 18.30 kg/m??   Temp (24hrs), Avg:98.7 ??F (37.1 ??C), Min:97.8 ??F (36.6 ??C), Max:101.8 ??F (38.8 ??C)    No results for input(s): POCGLU in the last 72 hours.    Intake/Output Summary (Last 24 hours) at 02/23/2021 1221  Last data filed at 02/23/2021 1016  Gross per 24 hour   Intake 426.11 ml   Output 0 ml   Net 426.11 ml       General Appearance:  alert, well appearing, and in no acute distress  Mental status: oriented to person, place, and time  Head:  normocephalic, atraumatic  Eye: no icterus, redness, pupils equal and reactive, extraocular eye movements intact, conjunctiva clear  Ear: normal external ear, no discharge, hearing intact  Nose:  no drainage noted  Mouth: mucous membranes moist  Neck: supple, no carotid bruits, thyroid not palpable  Lungs: Bilateral equal air entry, clear to auscultation, no wheezing, rales or rhonchi, normal effort.  Chest tube insertion site is clean and dry without signs of infiltration or induration.  No bloody or purulent drainage to the collection system.  Cardiovascular: normal rate, regular rhythm, no murmur, gallop, rub.  Abdomen: Soft, nontender, nondistended, normal bowel sounds, no hepatomegaly or splenomegaly  Neurologic: There are no new focal motor or sensory deficits, normal muscle tone and bulk, no abnormal sensation, normal speech, cranial nerves II through XII grossly intact  Skin: No gross lesions, rashes, bruising or bleeding on exposed skin area  Extremities:  peripheral pulses palpable, no pedal edema or calf pain with palpation  Psych: normal affect      Investigations:      Laboratory Testing:  Recent Results (from the past 24 hour(s))   EKG 12 Lead    Collection Time: 02/22/21  9:51 PM   Result Value Ref Range    Ventricular Rate 88 BPM    Atrial Rate 88 BPM    P-R Interval 124 ms    QRS Duration 100 ms    Q-T Interval 342 ms    QTc Calculation (Bazett) 413 ms    P Axis 78 degrees  R Axis 87 degrees    T Axis 69 degrees   STREP SCREEN GROUP A THROAT    Collection Time: 02/22/21 11:20 PM    Specimen: Throat   Result Value Ref Range    Specimen Description .THROAT     Direct Exam       Rapid Strep A negative. A negative Rapid Group A Strep Screen result does not rule out the possibility of Group A Streptococci in the specimen. A Group A Strep DNA test is available upon request.   COVID-19, Rapid    Collection Time: 02/22/21 11:20 PM    Specimen: Nasopharyngeal Swab   Result Value Ref Range    Specimen Description .NASOPHARYNGEAL SWAB     SARS-CoV-2, Rapid Not Detected Not Detected   Rapid influenza A/B antigens    Collection Time: 02/22/21 11:20 PM    Specimen: Nasopharyngeal Swab   Result Value Ref Range    Specimen Description .NASOPHARYNGEAL SWAB     Direct Exam       NEGATIVE for Influenza A + B antigens.                                PCR testing to confirm this result is available upon request.  Specimen will be saved in the laboratory for 7 days.  Please call [phone] if PCR testing is indicated.   CBC with Auto Differential    Collection Time: 02/22/21 11:30 PM   Result Value Ref Range    WBC 10.1 4.5 - 13.5 k/uL    RBC 5.19 4.21 - 5.77 m/uL    Hemoglobin 15.4 13.0 - 17.0 g/dL    Hematocrit 09.8 11.9 - 50.3 %    MCV 86.7 82.6 - 102.9 fL    MCH 29.7 25.2 - 33.5 pg    MCHC 34.2 28.4 - 34.8 g/dL    RDW 14.7 82.9 - 56.2 %    Platelets 205 138 - 453 k/uL    MPV 11.1 8.1 - 13.5 fL    NRBC Automated 0.0 0.0 per 100 WBC    Seg Neutrophils 84 (H) 34 - 64 %    Lymphocytes 7 (L) 25 - 45 %    Monocytes 6 2 - 8 %    Eosinophils % 0 (L) 1 - 4 %    Basophils 3 %     Immature Granulocytes 0 0 %    Segs Absolute 8.48 (H) 1.8 - 8.0 k/uL    Absolute Lymph # 0.71 (L) 1.2 - 5.2 k/uL    Absolute Mono # 0.61 0.2 - 0.8 k/uL    Absolute Eos # 0.00 0.0 - 0.4 k/uL    Basophils Absolute 0.30 (H) 0.0 - 0.2 k/uL    Absolute Immature Granulocyte 0.00 0.00 - 0.30 k/uL   Basic Metabolic Panel w/ Reflex to MG    Collection Time: 02/22/21 11:30 PM   Result Value Ref Range    Glucose 96 70 - 99 mg/dL    BUN 13 6 - 20 mg/dL    CREATININE 1.30 8.65 - 1.20 mg/dL    Bun/Cre Ratio 16 9 - 20    Calcium 9.6 8.6 - 10.4 mg/dL    Sodium 784 696 - 295 mmol/L    Potassium 4.1 3.7 - 5.3 mmol/L    Chloride 101 98 - 107 mmol/L    CO2 25 20 - 31 mmol/L    Anion Gap 11 9 - 17  mmol/L    GFR Non-African American >60 >60 mL/min    GFR African American >60 >60 mL/min    GFR Comment         Troponin    Collection Time: 02/22/21 11:30 PM   Result Value Ref Range    Troponin, High Sensitivity <6 0 - 22 ng/L   D-Dimer, Quantitative    Collection Time: 02/22/21 11:30 PM   Result Value Ref Range    D-Dimer, Quant 0.39 0.00 - 0.59 mg/L FEU   Lactate, Sepsis    Collection Time: 02/22/21 11:30 PM   Result Value Ref Range    Lactic Acid, Sepsis 1.4 0.5 - 1.9 mmol/L       Imaging/Diagnostics:  XR CHEST PORTABLE    Result Date: 02/23/2021  1. Resolved right pneumothorax following right chest tube placement.     XR CHEST PORTABLE    Addendum Date: 02/22/2021    ADDENDUM: This will assist with Dr. Cheri Fowler PA at 11:33 p.m.     Result Date: 02/22/2021  Moderate right pneumothorax       Assessment :      Hospital Problems           Last Modified POA    * (Principal) Primary spontaneous pneumothorax 02/23/2021 Yes    Nicotine dependence, uncomplicated 02/23/2021 Yes          Plan:     Patient status inpatient in the Med/Surge    1. Primary spontaneous pneumothorax, right  1. Maintain chest tube to wall suction as initiated emergency department  2. Cardiothoracic consultation, case discussed with them in person, plan for  waterseal/clamp tomorrow  2. Nicotine dependence, e-cigarette use  1. Nicotine patch as needed  2. Education on the need for abstinence    Consultations:   IP CONSULT TO INTERNAL MEDICINE  IP CONSULT TO PULMONOLOGY  IP CONSULT TO CARDIOTHORACIC SURGERY    Patient is admitted as inpatient status because of co-morbidities listed above, severity of signs and symptoms as outlined, requirement for current medical therapies and most importantly because of direct risk to patient if care not provided in a hospital setting.  Expected length of stay > 48 hours.    Ivar Drape, APRN - NP  02/23/2021  12:21 PM    Copy sent to Dr. Bonnetta Barry primary care provider on file.

## 2021-02-23 NOTE — Progress Notes (Signed)
Physical Therapy        Physical Therapy Cancel Note      DATE: 02/23/2021    NAME: Jack Harrington  MRN: [id number]   DOB: July 14, 2001      Patient not seen this date for Physical Therapy due to:    Pt. 20 years old and indep per RN    Electronically signed by Beverely Risen, PT on 02/23/2021 at 12:54 PM

## 2021-02-23 NOTE — Consults (Signed)
Pulmonary Medicine and Critical Care Consult    Patient Jack Harrington   MRN -  [id number]   Acct # - [id number]  DOB - Oct 30, 2001      Date of Admission -  02/22/2021 10:51 PM  Date of evaluation -  02/23/2021  Room - 1004/1004-02   Hospital Day - 0  Consulting - Leanora Ivanoff, DO Primary Care Physician - No primary care provider on file.     Reason for Consult      Pneumothorax  Assessment   ?? Spontaneous right pneumothorax  ?? Atelectasis   ?? atypical chest pain  ?? Mild intermittent asthma  ?? Vaping/history of THC use  ?? Rhinitis      Recommendations     ?? Incentive spirometry every hour while awake   ?? oxygen by nasal cannula  ?? Duoneb PRN   ?? Chest tube to suction  ?? Follow-up chest x-ray  ?? CT chest  without contrast   ?? CT surgery on consult  ?? Vaping and THC cessation  ?? Outpatient PFT  ?? Discussed with fianc??   ?? DVT prophylaxis      Problem List      Patient Active Problem List   Diagnosis   ??? Primary spontaneous pneumothorax   ??? Nicotine dependence, uncomplicated       HPI     Jack Harrington is 20 y.o.,Caucasian  male, previous medical history of asthma mild intermittent presented emergency room for stabbing left-sided chest pain severe that st associated with shortness of breath.  He has mostly dry cough with it.  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  In the ER he was found to have moderate right-sided pneumothorax.  He declined chest tube placement.  I was contacted by ER for consultation.  I requested to speak to the patient and he verbalized understanding of the situation but he stated was afraid . I  convinced him to have the chest tube placed  With sedation. He had chest tube placed . He has improved shortness of breath . He admits vaping and he reports he stopped THC use 2 month ago . He works as a Education administrator. [name] is at bedside    PMHx   Past Medical History  History reviewed. No pertinent past medical history.   Past Surgical History    History reviewed. No pertinent surgical  history.    Meds    Current Medications   ??? sodium chloride flush  5-40 mL IntraVENous 2 times per day   ??? enoxaparin  40 mg SubCUTAneous Daily   ??? [START ON 02/24/2021] azithromycin  500 mg Oral Daily     sodium chloride flush, sodium chloride, ondansetron **OR** ondansetron, polyethylene glycol, acetaminophen **OR** acetaminophen, albuterol, ipratropium-albuterol  IV Drips/Infusions  ??? sodium chloride       Home Medications  No medications prior to admission.    Allergies    Patient has no known allergies.  Social History     Social History     Socioeconomic History   ??? Marital status: Single     Spouse name: Not on file   ??? Number of children: Not on file   ??? Years of education: Not on file   ??? Highest education level: Not on file   Occupational History   ??? Not on file   Tobacco Use   ??? Smoking status: Current Every Day Smoker     Types: E-Cigarettes   ??? Smokeless tobacco:  Never Used   Vaping Use   ??? Vaping Use: Every day   Substance and Sexual Activity   ??? Alcohol use: Yes     Comment: weekends   ??? Drug use: Not Currently     Types: Marijuana Sheran Fava)   ??? Sexual activity: Not on file   Other Topics Concern   ??? Not on file   Social History Narrative   ??? Not on file     Social Determinants of Health     Financial Resource Strain:    ??? Difficulty of Paying Living Expenses: Not on file   Food Insecurity:    ??? Worried About Running Out of Food in the Last Year: Not on file   ??? Ran Out of Food in the Last Year: Not on file   Transportation Needs:    ??? Lack of Transportation (Medical): Not on file   ??? Lack of Transportation (Non-Medical): Not on file   Physical Activity:    ??? Days of Exercise per Week: Not on file   ??? Minutes of Exercise per Session: Not on file   Stress:    ??? Feeling of Stress : Not on file   Social Connections:    ??? Frequency of Communication with Friends and Family: Not on file   ??? Frequency of Social Gatherings with Friends and Family: Not on file   ??? Attends Religious Services: Not on file   ??? Active  Member of Clubs or Organizations: Not on file   ??? Attends Banker Meetings: Not on file   ??? Marital Status: Not on file   Intimate Partner Violence:    ??? Fear of Current or Ex-Partner: Not on file   ??? Emotionally Abused: Not on file   ??? Physically Abused: Not on file   ??? Sexually Abused: Not on file   Housing Stability:    ??? Unable to Pay for Housing in the Last Year: Not on file   ??? Number of Places Lived in the Last Year: Not on file   ??? Unstable Housing in the Last Year: Not on file     Family History          Problem Relation Age of Onset   ??? No Known Problems Mother    ??? No Known Problems Father      ROS - 11 systems   General Denies any fever or chills  HEENT Denies any diplopia, tinnitus or vertigo  Resp as above  Cardiac Denies any c palpitations, claudication or edema  GI Denies any melena, hematochezia, hematemesis or pyrosis  GU Denies any frequency, urgency, hesitancy or incontinence  Heme Denies bruising or bleeding easily  Endocrine Denies any history of diabetes or thyroid disease  Neuro Denies any focal motor  deficits  Psychiatric Denies anxiety, depression, suicidal ideation  Skin Denies rashes, itching, open sores    Vitals     height is 6' (1.829 m) and weight is 134 lb 14.4 oz (61.2 kg). His oral temperature is 99.1 ??F (37.3 ??C). His blood pressure is 116/72 and his pulse is 86. His respiration is 16 and oxygen saturation is 97%.   Body mass index is 18.3 kg/m??.  I/O        Intake/Output Summary (Last 24 hours) at 02/23/2021 1650  Last data filed at 02/23/2021 1016  Gross per 24 hour   Intake 426.11 ml   Output 0 ml   Net 426.11 ml     I/O last  3 completed shifts:  In: 426.1 [I.V.:128.4; IV Piggyback:297.7]  Out: -    Patient Vitals for the past 96 hrs (Last 3 readings):   Weight   02/23/21 0500 134 lb 14.4 oz (61.2 kg)   02/22/21 2156 135 lb 1.6 oz (61.3 kg)     Exam   General Appearance  Awake, alert, oriented, in no acute distress  HEENT - Head is normocephalic, atraumatic. Pupil  reactive to light  Neck - Supple, symmetrical, trachea midline and Soft, trachea midline and straight  Lungs -  Decreased breath sounds no crackles  Cardiovascular - Heart sounds are normal.  Regular rhythm normal rate without murmur, gallop or rub.  Abdomen - Soft, nontender, nondistended, no masses or organomegaly  Neurologic - CN II-XII are grossly intact. There are no focal motor deficits  Skin - No bruising or bleeding  Extremities - No cyanosis, clubbing or edema    Labs  - Old records and notes have been reviewed in [facility]   CBC     Lab Results   Component Value Date    WBC 10.1 02/22/2021    RBC 5.19 02/22/2021    HGB 15.4 02/22/2021    HCT 45.0 02/22/2021    PLT 205 02/22/2021    MCV 86.7 02/22/2021    MCH 29.7 02/22/2021    MCHC 34.2 02/22/2021    RDW 12.4 02/22/2021    LYMPHOPCT 7 02/22/2021    MONOPCT 6 02/22/2021    BASOPCT 3 02/22/2021    MONOSABS 0.61 02/22/2021    LYMPHSABS 0.71 02/22/2021    EOSABS 0.00 02/22/2021    BASOSABS 0.30 02/22/2021     BMP   Lab Results   Component Value Date    NA 137 02/22/2021    K 4.1 02/22/2021    CL 101 02/22/2021    CO2 25 02/22/2021    BUN 13 02/22/2021    CREATININE 0.81 02/22/2021    GLUCOSE 96 02/22/2021    CALCIUM 9.6 02/22/2021       Radiology    CXR       CXR 3/14  Moderate right pneumothorax measuring up to 35 mm in thickness. ??No midline   shift. ??Left lung is clear. ??Heart and mediastinum normal. ??Bony thorax   intact.           (See actual reports for details)    "Thank you for asking [place] to see this patient"    Case discussed with nurse and patient/family.  Questions and concerns addressed.    Electronically signed by     Ardelle Anton, MD on 02/23/2021 at 4:50 PM

## 2021-02-23 NOTE — ED Notes (Signed)
Dr. Tiney Rouge spoke with pt over phone about chest tube. Pt agreeable to chest tube placement.      Alphia Moh, RN  02/23/21 [phone]

## 2021-02-23 NOTE — Progress Notes (Signed)
Verbal okay from CT NP for patient to be disconnected from suction to ambulate to bathroom for BM only as long as patient doesn't develop SOB or chest pain. Patient ambulated to BR with no difficulties and hooked back up to suction.

## 2021-02-23 NOTE — ED Notes (Signed)
Report given to [facility] RN progressive     Alphia Moh, RN  02/23/21 0110

## 2021-02-23 NOTE — Care Coordination-Inpatient (Signed)
Case Management Initial Discharge Plan  Jack Harrington,         Readmission Risk              Risk of Unplanned Readmission:  7             Met with:patient to discuss discharge plans.   Information verified: address, contacts, phone number, DOB, insurance Yes  PCP: No primary care provider on file.  Date of last visit: na     Insurance Provider: none     Discharge Planning  Current Residence:  Private home   Living Arrangements:  Children,Family Members,Spouse/Significant Other (lives with fiancee [facility]) & son [name]) in fiancee's parents' house)       Home has 1 stories/0 stairs to climb  Support Systems:  Spouse/Significant Other,Family Members (fiancee [name]) & fiancee's parents)       Current Services PTA:  None  Agency: none      Patient able to perform ADL's:Independent  DME in home:  None   DME used to aid ambulation prior to admission:   None   DME used during admission:  Chest tube    Potential Assistance Needed:  N/A    Pharmacy: [facility] On Management consultant Medications:  No  Does patient want to participate in local refill/ meds to beds program?  No    Patient agreeable to home care: na  Freedom of choice provided:  n/a      Type of Home Care Services:  None  Patient expects to be discharged to:   home     Prior SNF/Rehab Placement and Facility: none   Agreeable to SNF/Rehab: No  Freedom of choice provided: n/a  Social Services Evaluation: n/a    Expected Discharge date:  02/26/21  Follow Up Appointment: Best Day/ Time:  any     Transportation provider: per fiance   Transportation arrangements needed for discharge: No    Discharge Plan:   Met with patient to complete a discharge assessment. Patient lives with fiance and her parents. He works, drives and is independent. Used no DME>     Admitted for pneumothorax. Chest tube to suction at the time of assessment. CTSX on and plan to place CT to water seal tomorrow.     Patient has no insurance. Did call [employer] and they did see. He is  not eligible for RX assist nor medicaid.  He has  No PCP and clinic list and [facility] list given to him     Electronically signed by Sherald Hess, RN on 02/23/21 at 11:48 AM EDT

## 2021-02-23 NOTE — Plan of Care (Signed)
Problem: Falls - Risk of:  Goal: Will remain free from falls  Description: Will remain free from falls  Outcome: Ongoing  Note: Pt fall risk, fall band present, falling star, safety alarm activated and in use as needed. Hourly rounding performed. Pt encouraged to use call light. See Lattie Corns fall risk assessment.   Goal: Absence of physical injury  Description: Absence of physical injury  Outcome: Ongoing  Note: Non-skid socks in place, up with assistance, bed in lowest position, bed exit & alarm as needed, provide toileting every 2 hours an d as needed.      Problem: Breathing Pattern - Ineffective:  Goal: Ability to achieve and maintain a regular respiratory rate will improve  Description: Ability to achieve and maintain a regular respiratory rate will improve  Outcome: Ongoing  Note: Assess for adventitious breath sounds.  Monitored SaO2 > 90%.  Applied 02 per nasal cannula as needed.  Elevated HOB to improve breathing as needed.        Problem: Skin Integrity:  Goal: Will show no infection signs and symptoms  Description: Will show no infection signs and symptoms  Outcome: Ongoing  Note: Monitor for signs & symptoms of infection.  Utilize standard precautions & proper handwashing.  Communicate referral to infection control.     Goal: Absence of new skin breakdown  Description: Absence of new skin breakdown  Outcome: Ongoing  Note: Continuing to monitor for skin integrity risks. Patient independent with turning/repositioning. Turning/repositioning encouraged at least once every 2 hrs, and prn basis. Hygiene care being completed independently per patient; assistance provided when deemed necessary.

## 2021-02-23 NOTE — Plan of Care (Signed)
Problem: Falls - Risk of:  Goal: Will remain free from falls  Description: Will remain free from falls  02/23/2021 1956 by Janey Greaser, RN  Outcome: Ongoing  Note: Pt fall risk, fall band present, falling star, safety alarm activated and in use as needed. Hourly rounding performed. Pt encouraged to use call light. See Lattie Corns fall risk assessment.   02/23/2021 1818 by Ileene Patrick, RN  Outcome: Ongoing  Goal: Absence of physical injury  Description: Absence of physical injury  Outcome: Ongoing  Note: Non-skid socks in place, up with assistance, bed in lowest position, bed exit & alarm as needed, provide toileting every 2 hours an d as needed.      Problem: Breathing Pattern - Ineffective:  Goal: Ability to achieve and maintain a regular respiratory rate will improve  Description: Ability to achieve and maintain a regular respiratory rate will improve  02/23/2021 1956 by Janey Greaser, RN  Outcome: Ongoing  Note: Assess for adventitious breath sounds.  Monitored SaO2 > 90%.  Applied 02 per nasal cannula as needed.  Elevated HOB to improve breathing as needed.     02/23/2021 1818 by Ileene Patrick, RN  Outcome: Ongoing     Problem: Skin Integrity:  Goal: Will show no infection signs and symptoms  Description: Will show no infection signs and symptoms  Outcome: Ongoing  Note: Continuing to monitor for skin integrity risks. Patient independent with turning/repositioning. Turning/repositioning encouraged at least once every 2 hrs, and prn basis. Hygiene care being completed independently per patient; assistance provided when deemed necessary.   Goal: Absence of new skin breakdown  Description: Absence of new skin breakdown  Outcome: Ongoing  Note: Continuing to monitor for skin integrity risks. Patient independent with turning/repositioning. Turning/repositioning encouraged at least once every 2 hrs, and prn basis. Hygiene care being completed independently per patient; assistance provided when deemed necessary.

## 2021-02-24 ENCOUNTER — Inpatient Hospital Stay: Admit: 2021-02-24 | Payer: BLUE CROSS/BLUE SHIELD

## 2021-02-24 ENCOUNTER — Inpatient Hospital Stay: Payer: BLUE CROSS/BLUE SHIELD

## 2021-02-24 LAB — EKG 12-LEAD
Atrial Rate: 88 {beats}/min
P Axis: 78 degrees
P-R Interval: 124 ms
Q-T Interval: 342 ms
QRS Duration: 100 ms
QTc Calculation (Bazett): 413 ms
R Axis: 87 degrees
T Axis: 69 degrees
Ventricular Rate: 88 {beats}/min

## 2021-02-24 LAB — BASIC METABOLIC PANEL W/ REFLEX TO MG FOR LOW K
Anion Gap: 7 mmol/L — ABNORMAL LOW (ref 9–17)
BUN: 10 mg/dL (ref 6–20)
Bun/Cre Ratio: 11 (ref 9–20)
CO2: 26 mmol/L (ref 20–31)
Calcium: 9.7 mg/dL (ref 8.6–10.4)
Chloride: 106 mmol/L (ref 98–107)
Creatinine: 0.94 mg/dL (ref 0.70–1.20)
GFR African American: >60 mL/min (ref >60)
GFR Non-African American: >60 mL/min (ref >60)
Glucose: 103 mg/dL — ABNORMAL HIGH (ref 70–99)
Potassium: 4.7 mmol/L (ref 3.7–5.3)
Sodium: 139 mmol/L (ref 135–144)

## 2021-02-24 LAB — TYPE AND SCREEN
ABO/Rh: A POS
Antibody Screen: NEGATIVE

## 2021-02-24 MED FILL — AZITHROMYCIN 250 MG PO TABS: 250 MG | ORAL | Qty: 2

## 2021-02-24 MED FILL — LOVENOX 40 MG/0.4ML SC SOLN: 40 MG/0.4ML | SUBCUTANEOUS | Qty: 0.4

## 2021-02-24 NOTE — Plan of Care (Signed)
Problem: Falls - Risk of:  Goal: Will remain free from falls  Description: Will remain free from falls  Outcome: Ongoing  Goal: Absence of physical injury  Description: Absence of physical injury  Outcome: Ongoing     Problem: Breathing Pattern - Ineffective:  Goal: Ability to achieve and maintain a regular respiratory rate will improve  Description: Ability to achieve and maintain a regular respiratory rate will improve  Outcome: Ongoing     Problem: Skin Integrity:  Goal: Will show no infection signs and symptoms  Description: Will show no infection signs and symptoms  Outcome: Ongoing  Goal: Absence of new skin breakdown  Description: Absence of new skin breakdown  Outcome: Ongoing

## 2021-02-24 NOTE — Progress Notes (Signed)
Dr. Suzanna Obey notified via [employer] about CXR results.

## 2021-02-24 NOTE — Progress Notes (Signed)
[facility]  Daily Progress Note    Subjective:  Jack Harrington feels well today with no acute complaints.    Physical Exam  Vital Signs: BP 121/86    Pulse 82    Temp 97.9 ??F (36.6 ??C) (Oral)    Resp 18    Ht 6' (1.829 m)    Wt 134 lb 14.4 oz (61.2 kg)    SpO2 96%    BMI 18.30 kg/m??      Admit Weight: Weight: 135 lb 1.6 oz (61.3 kg)   WEIGHTWeight: 134 lb 14.4 oz (61.2 kg)     General: alert and oriented to person, place and time, well-developed and well-nourished, in no acute distress.   Heart: Normal S1 and S2.  Regular rhythm. No murmurs, gallops, or rubs.    Lungs: clear to auscultation bilaterally Chest tubes: Yes, Air Leak: No   Abdomen: soft, non tender, non distended, BS x4  Extremities: negative  Wounds: clean and dry, healing appropriately.       Scheduled Meds:   ??? sodium chloride flush  5-40 mL IntraVENous 2 times per day   ??? enoxaparin  40 mg SubCUTAneous Daily   ??? azithromycin  500 mg Oral Daily     Continuous Infusions:   ??? sodium chloride         Data:  CBC:   Recent Labs     02/22/21  2330   WBC 10.1   HGB 15.4   HCT 45.0   MCV 86.7   PLT 205     BMP:   Recent Labs     02/22/21  2330 02/24/21  0528   NA 137 139   K 4.1 4.7   CL 101 106   CO2 25 26   BUN 13 10   CREATININE 0.81 0.94     PT/INR: No results for input(s): PROTIME, INR in the last 72 hours.  APTT: No results for input(s): APTT in the last 72 hours.    Chest X-Ray:  Image reviewed. Awaiting results    I/O:  I/O last 3 completed shifts:  In: 426.1 [I.V.:128.4; IV Piggyback:297.7]  Out: 0     Assessment/Plan:  History reviewed. No pertinent past medical history.  ??? Neuro: Intact  ??? Lungs: clear  ??? HD: VSS  ??? Edema: none noted  ??? GI: Active bowel sounds X4  ??? GU: Good urine output    ??? Oxygen as needed to maintain SpO2 > 92%  o Wean as tolerated  ??? Chest x-ray daily  ??? Keep Chest Tubes to wall suction  o May evaluate for possible water seal later today  ??? Encourage incentive spirometry, acapella and ambulation    ??? Replace electrolytes as needed per sliding scale and recheck per policy  ??? Case Management consult for discharge planning    The above recommendations including medications and orders were discussed and agreed upon with Dr. Suzanna Obey, the attending on service for the cardiothoracic surgery group today.     Electronically signed by Anne Shutter, APRN - CNP on 02/24/2021 at 6:27 AM    On this date 02/24/2021 I have spent 27 minutes reviewing previous notes, test results and face to face with the patient discussing the diagnosis and importance of compliance with the treatment plan as well as documenting on the day of the visit. At least 50% of the time documented was spent with the patient to provide counseling and/or coordination of care.    This note was  created with the assistance of a speech-recognition program.  Although the intention is to generate a document that actually reflects the content of the visit, no guarantees can be provided that every mistake has been identified and corrected by editing.     Note was updated later by me after  physical examination and  completion of the assessment.

## 2021-02-24 NOTE — Plan of Care (Addendum)
CXR shows improvement after placed back to suction. I've ordered a CXR in morning to evaluate necessity for surgery. Patient made NPO per Dr. Suzanna Obey for possible OR tomorrow. He will discuss with patient tomorrow.     Erskine Emery, PA

## 2021-02-24 NOTE — Progress Notes (Signed)
[facility]  Office: [phone]  Ardelle Anton, DO, Susy Frizzle, DO, Freddi Che, DO, Tinnie Gens Blood, DO, Salome Arnt, MD, Lowry Bowl, MD, Arnold Long, MD, Chad Cordial, MD, Minus Breeding, MD, Seabron Spates, MD, Yolanda Bonine, MD, Silvio Clayman, DO, Juleen Starr, DO, Ree Shay, MD,  Orion Modest, DO, Dinah Beers, MD, Jeanmarie Plant, MD, Tiney Rouge, MD, Burman Nieves, DO, Wilnette Kales, MD, Durwin Glaze, MD, Carlena Hurl, CNP, [facility] DelGrosso, CNP, Marylin Crosby, CNP, Reola Mosher, CNS, Erick Alley, CNP, Ival Bible, CNP, Dereck Ligas, CNP, Luberta Mutter, CNP, Henry Russel, CNP, Luna Glasgow, PA-C, Bess Kinds, DNP, Minna Antis, DNP, Farrel Gordon, CNP, Glennie Hawk, CNP, Ileene Patrick, CNP, Franklyn Lor, CNP, Hulen Shouts, CNP, Rikki Spearing, CNP         [facility]   [facility]    Progress Note    02/24/2021    11:53 AM    Name:   Jack Harrington  MRN:     [id number]     Acct:      [id number]   Room:   [id number]  IP Day:  1  Admit Date:  02/22/2021 10:51 PM    PCP:   No primary care provider on file.  Code Status:  Full Code    Subjective:     C/C:   Chief Complaint   Patient presents with   ??? Chest Pain   ??? Cough     Interval History Status: not changed.     Patient reports significant improvement in respiratory status.  He denies any chest pain except at the insertion site of the chest tube.  Patient is highly motivated for discharge.  Repeat chest x-ray was completed and did unfortunately show a recurrence of the pneumothorax.  Cardiothoracic is aware and we await their direction.    Brief History:     3/15 - Patient reports that he was at work and had the sensation of a sneeze coming.  Patient held his sneeze by holding his breath and shortly thereafter he developed severe stabbing chest pain rated at an 8/10 with a cough and shortness of breath.  Patient reported to the emergency department for evaluation.  In the  emergency department patient was found to have a moderate pneumothorax to the right.  Patient is a very tall and thin white male putting him at an increased risk for spontaneous pneumothorax.  Patient also has a known history of e-cigarette use.  Emergency department placed a small caliber chest tube and did evacuate the pneumothorax.  During my exam today patient is resting comfortably and his only complaint is mild pain at the insertion site.  Patient is on wall suction and we have requested cardiothoracic to evaluate the patient as well.    3/16 - Patient reports significant improvement in respiratory status.  He denies any chest pain except at the insertion site of the chest tube.  Patient is highly motivated for discharge.  Repeat chest x-ray was completed and did unfortunately show a recurrence of the pneumothorax.  Cardiothoracic is aware and we await their direction.    Review of Systems:     Constitutional:  negative for chills, fevers, sweats  Respiratory:  negative for cough, dyspnea on exertion, shortness of breath, wheezing  Cardiovascular:  negative for chest pain, chest pressure/discomfort, lower extremity edema, palpitations  Gastrointestinal:  negative for abdominal pain, constipation, diarrhea, nausea, vomiting  Neurological:  negative for dizziness, headache    Medications:  Allergies:  No Known Allergies    Current Meds:   Scheduled Meds:   ??? sodium chloride flush  5-40 mL IntraVENous 2 times per day   ??? enoxaparin  40 mg SubCUTAneous Daily   ??? azithromycin  500 mg Oral Daily     Continuous Infusions:   ??? sodium chloride       PRN Meds: sodium chloride flush, sodium chloride, ondansetron **OR** ondansetron, polyethylene glycol, acetaminophen **OR** acetaminophen, albuterol, ipratropium-albuterol    Data:     Past Medical History:   has no past medical history on file.    Social History:   reports that he has been smoking e-cigarettes. He has never used smokeless tobacco. He reports current  alcohol use. He reports previous drug use. Drug: Marijuana Sheran Fava).     Family History:   Family History   Problem Relation Age of Onset   ??? No Known Problems Mother    ??? No Known Problems Father        Vitals:  BP 111/69    Pulse 73    Temp 97.7 ??F (36.5 ??C) (Oral)    Resp 16    Ht 6' (1.829 m)    Wt 134 lb 14.4 oz (61.2 kg)    SpO2 96%    BMI 18.30 kg/m??   Temp (24hrs), Avg:98 ??F (36.7 ??C), Min:97.3 ??F (36.3 ??C), Max:99.1 ??F (37.3 ??C)    No results for input(s): POCGLU in the last 72 hours.    I/O (24Hr):    Intake/Output Summary (Last 24 hours) at 02/24/2021 1153  Last data filed at 02/23/2021 1820  Gross per 24 hour   Intake --   Output 0 ml   Net 0 ml       Labs:  Hematology:  Recent Labs     02/22/21  2330   WBC 10.1   RBC 5.19   HGB 15.4   HCT 45.0   MCV 86.7   MCH 29.7   MCHC 34.2   RDW 12.4   PLT 205   MPV 11.1   DDIMER 0.39     Chemistry:  Recent Labs     02/22/21  2330 02/24/21  0528   NA 137 139   K 4.1 4.7   CL 101 106   CO2 25 26   GLUCOSE 96 103*   BUN 13 10   CREATININE 0.81 0.94   ANIONGAP 11 7*   LABGLOM >60 >60   GFRAA >60 >60   CALCIUM 9.6 9.7   TROPHS <6  --    No results for input(s): PROT, LABALBU, LABA1C, T3TOTAL, T4TOTAL, FT4, TSH, AST, ALT, LDH, GGT, ALKPHOS, LABGGT, BILITOT, BILIDIR, AMMONIA, AMYLASE, LIPASE, LACTATE, CHOL, HDL, LDLCHOLESTEROL, CHOLHDLRATIO, TRIG, VLDL, HIV12AB, PHENYTOIN, PHENYF, URICACID, POCGLU in the last 72 hours.  ABG:No results found for: POCPH, PHART, PH, POCPCO2, PCO2ART, PCO2, POCPO2, PO2ART, PO2, POCHCO3, HCO3ART, HCO3, NBEA, PBEA, BEART, BE, THGBART, THB, TCO2ART, POCO2SAT, O2SATART, O2SAT, FIO2  No results found for: SPECIAL  No results found for: CULTURE    Radiology:  CT CHEST WO CONTRAST    Result Date: 02/24/2021  1. There is a small bore right-sided chest tube noted anteriorly in the chest with a small right-sided pneumothorax.     XR CHEST PORTABLE    Result Date: 02/24/2021  Recurrence of the right mild to moderate pneumothorax.     XR CHEST  PORTABLE    Result Date: 02/23/2021  1. Resolved right pneumothorax following right chest tube placement.  XR CHEST PORTABLE    Addendum Date: 02/22/2021    ADDENDUM: This will assist with Dr. Cheri Fowler PA at 11:33 p.m.     Result Date: 02/22/2021  Moderate right pneumothorax       Physical Examination:        General appearance:  alert, cooperative and no distress  Mental Status:  oriented to person, place and time and normal affect  Lungs:  clear to auscultation bilaterally, normal effort.  Chest tube remains in place.  Heart:  regular rate and rhythm, no murmur  Abdomen:  soft, nontender, nondistended, normal bowel sounds, no masses, hepatomegaly, splenomegaly  Extremities:  no edema, redness, tenderness in the calves  Skin:  no gross lesions, rashes, induration    Assessment:        Hospital Problems           Last Modified POA    * (Principal) Primary spontaneous pneumothorax 02/23/2021 Yes    Nicotine dependence, uncomplicated 02/23/2021 Yes          Plan:        1. Primary spontaneous pneumothorax, right  1. Maintain chest tube to wall suction as initiated emergency department  2. Cardiothoracic consultation, case discussed with them in person, notified of recurrence of pneumothorax  2. Nicotine dependence, e-cigarette use  1. Nicotine patch as needed  2. Education on the need for abstinence      Ivar Drape, APRN - NP  02/24/2021  11:53 AM

## 2021-02-24 NOTE — Plan of Care (Signed)
CXR reviewed. Patient with small to moderate pneumo. Suction on the chest tube increased to -40cm. The patient was instructed on the use of an IS. Will repeat CXR in 2-3 hours.

## 2021-02-24 NOTE — Plan of Care (Signed)
Problem: Falls - Risk of:  Goal: Will remain free from falls  Description: Will remain free from falls  02/24/2021 1950 by Jule Economy, RN  Outcome: Ongoing     Problem: Falls - Risk of:  Goal: Absence of physical injury  Description: Absence of physical injury  02/24/2021 1950 by Jule Economy, RN  Outcome: Ongoing     Problem: Breathing Pattern - Ineffective:  Goal: Ability to achieve and maintain a regular respiratory rate will improve  Description: Ability to achieve and maintain a regular respiratory rate will improve  02/24/2021 1950 by Jule Economy, RN  Outcome: Ongoing     Problem: Skin Integrity:  Goal: Will show no infection signs and symptoms  Description: Will show no infection signs and symptoms  02/24/2021 1950 by Jule Economy, RN  Outcome: Ongoing     Problem: Skin Integrity:  Goal: Absence of new skin breakdown  Description: Absence of new skin breakdown  02/24/2021 1950 by Jule Economy, RN  Outcome: Ongoing

## 2021-02-25 ENCOUNTER — Inpatient Hospital Stay: Payer: BLUE CROSS/BLUE SHIELD

## 2021-02-25 ENCOUNTER — Inpatient Hospital Stay: Admit: 2021-02-25 | Payer: BLUE CROSS/BLUE SHIELD

## 2021-02-25 LAB — CBC WITH AUTO DIFFERENTIAL
Absolute Eos #: 0.09 10*3/uL (ref 0.00–0.44)
Absolute Immature Granulocyte: 0.01 10*3/uL (ref 0.00–0.30)
Absolute Lymph #: 1.46 10*3/uL (ref 1.20–5.20)
Absolute Mono #: 0.49 10*3/uL (ref 0.10–1.40)
Basophils Absolute: 0.03 10*3/uL (ref 0.00–0.20)
Basophils: 1 % (ref 0–2)
Eosinophils %: 2 % (ref 1–4)
Hematocrit: 46.2 % (ref 40.7–50.3)
Hemoglobin: 15.8 g/dL (ref 13.0–17.0)
Immature Granulocytes: 0 %
Lymphocytes: 31 % (ref 25–45)
MCH: 29.6 pg (ref 25.2–33.5)
MCHC: 34.2 g/dL (ref 28.4–34.8)
MCV: 86.5 fL (ref 82.6–102.9)
MPV: 10.5 fL (ref 8.1–13.5)
Monocytes: 10 % — ABNORMAL HIGH (ref 2–8)
NRBC Automated: 0 per 100 WBC
Platelets: 217 10*3/uL (ref 138–453)
RBC: 5.34 m/uL (ref 4.21–5.77)
RDW: 12.2 % (ref 11.8–14.4)
Seg Neutrophils: 56 % (ref 34–64)
Segs Absolute: 2.68 10*3/uL (ref 1.80–8.00)
WBC: 4.8 10*3/uL (ref 4.5–13.5)

## 2021-02-25 LAB — PROTIME-INR
INR: 1
Protime: 12.9 s (ref 11.5–14.2)

## 2021-02-25 LAB — BASIC METABOLIC PANEL W/ REFLEX TO MG FOR LOW K
Anion Gap: 12 mmol/L (ref 9–17)
BUN: 14 mg/dL (ref 6–20)
Bun/Cre Ratio: 16 (ref 9–20)
CO2: 24 mmol/L (ref 20–31)
Calcium: 9.8 mg/dL (ref 8.6–10.4)
Chloride: 104 mmol/L (ref 98–107)
Creatinine: 0.86 mg/dL (ref 0.70–1.20)
GFR African American: >60 mL/min (ref >60)
GFR Non-African American: >60 mL/min (ref >60)
Glucose: 87 mg/dL (ref 70–99)
Potassium: 4.2 mmol/L (ref 3.7–5.3)
Sodium: 140 mmol/L (ref 135–144)

## 2021-02-25 MED FILL — AZITHROMYCIN 250 MG PO TABS: 250 MG | ORAL | Qty: 2

## 2021-02-25 NOTE — Progress Notes (Signed)
Spoke to Gustavus Bryant NP regarding chest XR results. States to keep him to -40 suction, and recheck the XR in the AM. He is able to eat, no surgery plans for today.

## 2021-02-25 NOTE — Progress Notes (Signed)
[facility]  Office: [phone]  Ardelle Anton, DO, Susy Frizzle, DO, Freddi Che, DO, Tinnie Gens Blood, DO, Salome Arnt, MD, Lowry Bowl, MD, Arnold Long, MD, Chad Cordial, MD, Minus Breeding, MD, Seabron Spates, MD, Yolanda Bonine, MD, Silvio Clayman, DO, Juleen Starr, DO, Ree Shay, MD,  Orion Modest, DO, Dinah Beers, MD, Jeanmarie Plant, MD, Tiney Rouge, MD, Burman Nieves, DO, Wilnette Kales, MD, Durwin Glaze, MD, Carlena Hurl, CNP, [facility] DelGrosso, CNP, Marylin Crosby, CNP, Reola Mosher, CNS, Erick Alley, CNP, Ival Bible, CNP, Dereck Ligas, CNP, Luberta Mutter, CNP, Henry Russel, CNP, Luna Glasgow, PA-C, Bess Kinds, DNP, Minna Antis, DNP, Farrel Gordon, CNP, Glennie Hawk, CNP, Ileene Patrick, CNP, Franklyn Lor, CNP, Hulen Shouts, CNP, Rikki Spearing, CNP         [facility]   [facility]    Progress Note    02/25/2021    10:03 AM    Name:   Jack Harrington  MRN:     [id number]     Acct:      [id number]   Room:   [id number]  IP Day:  2  Admit Date:  02/22/2021 10:51 PM    PCP:   No primary care provider on file.  Code Status:  Full Code    Subjective:     C/C:   Chief Complaint   Patient presents with   ??? Chest Pain   ??? Cough     Interval History Status: not changed vs slightly worse.     Patient condition essentially unchanged.  Chest tube was clamped last night and unfortunately patient did experience a recurrence of his pneumothorax.  CT surgery plans for OR later today.  Patient kept n.p.o.  All questions answered and patient is agreeable.     Brief History:     3/15 - Patient reports that he was at work and had the sensation of a sneeze coming.  Patient held his sneeze by holding his breath and shortly thereafter he developed severe stabbing chest pain rated at an 8/10 with a cough and shortness of breath.  Patient reported to the emergency department for evaluation.  In the emergency department patient was found to have a moderate  pneumothorax to the right.  Patient is a very tall and thin white male putting him at an increased risk for spontaneous pneumothorax.  Patient also has a known history of e-cigarette use.  Emergency department placed a small caliber chest tube and did evacuate the pneumothorax.  During my exam today patient is resting comfortably and his only complaint is mild pain at the insertion site.  Patient is on wall suction and we have requested cardiothoracic to evaluate the patient as well.    3/16 - Patient reports significant improvement in respiratory status.  He denies any chest pain except at the insertion site of the chest tube.  Patient is highly motivated for discharge.  Repeat chest x-ray was completed and did unfortunately show a recurrence of the pneumothorax.  Cardiothoracic is aware and we await their direction.    3/17 -condition essentially unchanged.  Chest tube was clamped last night and unfortunately patient did experience a recurrence of his pneumothorax.  CT surgery plans for OR later today.  Patient kept n.p.o.  All questions answered and patient is agreeable.     Review of Systems:     Constitutional:  negative for chills, fevers, sweats  Respiratory:  negative for cough, dyspnea on exertion, shortness of breath,  wheezing  Cardiovascular:  negative for chest pain, chest pressure/discomfort, lower extremity edema, palpitations  Gastrointestinal:  negative for abdominal pain, constipation, diarrhea, nausea, vomiting  Neurological:  negative for dizziness, headache    Medications:     Allergies:  No Known Allergies    Current Meds:   Scheduled Meds:   ??? sodium chloride flush  5-40 mL IntraVENous 2 times per day   ??? enoxaparin  40 mg SubCUTAneous Daily   ??? azithromycin  500 mg Oral Daily     Continuous Infusions:   ??? sodium chloride       PRN Meds: sodium chloride flush, sodium chloride, ondansetron **OR** ondansetron, polyethylene glycol, acetaminophen **OR** acetaminophen, albuterol,  ipratropium-albuterol    Data:     Past Medical History:   has no past medical history on file.    Social History:   reports that he has been smoking e-cigarettes. He has never used smokeless tobacco. He reports current alcohol use. He reports previous drug use. Drug: Marijuana Sheran Fava).     Family History:   Family History   Problem Relation Age of Onset   ??? No Known Problems Mother    ??? No Known Problems Father        Vitals:  BP 134/88    Pulse 77    Temp 97.7 ??F (36.5 ??C) (Oral)    Resp 14    Ht 6' (1.829 m)    Wt 134 lb 14.4 oz (61.2 kg)    SpO2 97%    BMI 18.30 kg/m??   Temp (24hrs), Avg:97.6 ??F (36.4 ??C), Min:97.3 ??F (36.3 ??C), Max:97.7 ??F (36.5 ??C)    No results for input(s): POCGLU in the last 72 hours.    I/O (24Hr):    Intake/Output Summary (Last 24 hours) at 02/25/2021 1003  Last data filed at 02/24/2021 1545  Gross per 24 hour   Intake --   Output 0 ml   Net 0 ml       Labs:  Hematology:  Recent Labs     02/22/21  2330   WBC 10.1   RBC 5.19   HGB 15.4   HCT 45.0   MCV 86.7   MCH 29.7   MCHC 34.2   RDW 12.4   PLT 205   MPV 11.1   DDIMER 0.39     Chemistry:  Recent Labs     02/22/21  2330 02/24/21  0528   NA 137 139   K 4.1 4.7   CL 101 106   CO2 25 26   GLUCOSE 96 103*   BUN 13 10   CREATININE 0.81 0.94   ANIONGAP 11 7*   LABGLOM >60 >60   GFRAA >60 >60   CALCIUM 9.6 9.7   TROPHS <6  --    No results for input(s): PROT, LABALBU, LABA1C, T3TOTAL, T4TOTAL, FT4, TSH, AST, ALT, LDH, GGT, ALKPHOS, LABGGT, BILITOT, BILIDIR, AMMONIA, AMYLASE, LIPASE, LACTATE, CHOL, HDL, LDLCHOLESTEROL, CHOLHDLRATIO, TRIG, VLDL, HIV12AB, PHENYTOIN, PHENYF, URICACID, POCGLU in the last 72 hours.  ABG:No results found for: POCPH, PHART, PH, POCPCO2, PCO2ART, PCO2, POCPO2, PO2ART, PO2, POCHCO3, HCO3ART, HCO3, NBEA, PBEA, BEART, BE, THGBART, THB, TCO2ART, POCO2SAT, O2SATART, O2SAT, FIO2  No results found for: SPECIAL  No results found for: CULTURE    Radiology:  CT CHEST WO CONTRAST    Result Date: 02/24/2021  1. There is a small bore  right-sided chest tube noted anteriorly in the chest with a small right-sided pneumothorax.     XR  CHEST PORTABLE    Result Date: 02/24/2021  Recurrence of the right mild to moderate pneumothorax.     XR CHEST PORTABLE    Result Date: 02/23/2021  1. Resolved right pneumothorax following right chest tube placement.     XR CHEST PORTABLE    Addendum Date: 02/22/2021    ADDENDUM: This will assist with Dr. Cheri Fowler PA at 11:33 p.m.     Result Date: 02/22/2021  Moderate right pneumothorax       Physical Examination:        General appearance:  alert, cooperative and no distress  Mental Status:  oriented to person, place and time and normal affect  Lungs:  clear to auscultation bilaterally, normal effort.  Chest tube remains in place.  Heart:  regular rate and rhythm, no murmur  Abdomen:  soft, nontender, nondistended, normal bowel sounds, no masses, hepatomegaly, splenomegaly  Extremities:  no edema, redness, tenderness in the calves  Skin:  no gross lesions, rashes, induration    Assessment:        Hospital Problems           Last Modified POA    * (Principal) Primary spontaneous pneumothorax 02/23/2021 Yes    Nicotine dependence, uncomplicated 02/23/2021 Yes          Plan:        1. Primary spontaneous pneumothorax, right  1. Maintain chest tube to wall suction as initiated emergency department  2. Cardiothoracic consultation, case discussed with them in person, notified of recurrence of pneumothorax  3. Reported plans for the OR later today  2. Nicotine dependence, e-cigarette use  1. Nicotine patch as needed  2. Education on the need for abstinence      Ivar Drape, APRN - NP  02/25/2021  10:03 AM

## 2021-02-25 NOTE — Plan of Care (Signed)
Problem: Falls - Risk of:  Goal: Will remain free from falls  Description: Will remain free from falls  02/25/2021 0836 by Gaylyn Cheers, RN  Outcome: Ongoing  Patient is fall risk per fall scale. Falling star on door. Fall sticker on armband. Hourly rounding performed. Personal belongings and call light within reach. Bed in low position.

## 2021-02-25 NOTE — Care Coordination-Inpatient (Addendum)
DC Planning    Spoke with [employer] pt does not qualify for Medicaid-did explain to pt. He will qualify for assistance with hospital bill. Did discuss other means for insurance-[employer]/his employer does not provide health insurance(small company), or pay for private.     Pt may go to surgery today with CT surgery.

## 2021-02-25 NOTE — Plan of Care (Signed)
Problem: Falls - Risk of:  Goal: Will remain free from falls  Description: Will remain free from falls  02/25/2021 1939 by Jule Economy, RN  Outcome: Ongoing     Problem: Falls - Risk of:  Goal: Absence of physical injury  Description: Absence of physical injury  02/25/2021 1939 by Jule Economy, RN  Outcome: Ongoing     Problem: Breathing Pattern - Ineffective:  Goal: Ability to achieve and maintain a regular respiratory rate will improve  Description: Ability to achieve and maintain a regular respiratory rate will improve  02/25/2021 1939 by Jule Economy, RN  Outcome: Ongoing     Problem: Skin Integrity:  Goal: Will show no infection signs and symptoms  Description: Will show no infection signs and symptoms  02/25/2021 1939 by Jule Economy, RN  Outcome: Ongoing     Problem: Skin Integrity:  Goal: Absence of new skin breakdown  Description: Absence of new skin breakdown  02/25/2021 1939 by Jule Economy, RN  Outcome: Ongoing

## 2021-02-25 NOTE — Plan of Care (Signed)
Awaiting CXR to decide if patient will need to go to OR today.  Keep patient NPO

## 2021-02-26 ENCOUNTER — Inpatient Hospital Stay: Admit: 2021-02-26 | Payer: BLUE CROSS/BLUE SHIELD

## 2021-02-26 MED ORDER — FENTANYL CITRATE (PF) 100 MCG/2ML IJ SOLN
100 MCG/2ML | Freq: Once | INTRAMUSCULAR | Status: AC | PRN
Start: 2021-02-26 — End: 2021-02-26
  Administered 2021-02-26: 14:00:00 50 via INTRAVENOUS

## 2021-02-26 MED ORDER — OXYCODONE-ACETAMINOPHEN 5-325 MG PO TABS
5-325 MG | ORAL | Status: DC | PRN
Start: 2021-02-26 — End: 2021-02-26
  Administered 2021-02-26: 15:00:00 2 via ORAL

## 2021-02-26 MED ORDER — TRAMADOL HCL 50 MG PO TABS
50 MG | Freq: Four times a day (QID) | ORAL | Status: DC | PRN
Start: 2021-02-26 — End: 2021-03-04

## 2021-02-26 MED ORDER — FENTANYL CITRATE (PF) 100 MCG/2ML IJ SOLN
100 | INTRAMUSCULAR | Status: AC
Start: 2021-02-26 — End: 2021-02-26

## 2021-02-26 MED ORDER — OXYCODONE-ACETAMINOPHEN 5-325 MG PO TABS
5-325 MG | ORAL | Status: DC | PRN
Start: 2021-02-26 — End: 2021-02-26

## 2021-02-26 MED ORDER — TRAMADOL HCL 50 MG PO TABS
50 MG | Freq: Four times a day (QID) | ORAL | Status: DC | PRN
Start: 2021-02-26 — End: 2021-03-04
  Administered 2021-02-26 – 2021-02-28 (×2): 50 mg via ORAL

## 2021-02-26 MED FILL — ACETAMINOPHEN 325 MG PO TABS: 325 MG | ORAL | Qty: 2

## 2021-02-26 MED FILL — FENTANYL CITRATE (PF) 100 MCG/2ML IJ SOLN: 100 MCG/2ML | INTRAMUSCULAR | Qty: 2

## 2021-02-26 MED FILL — TRAMADOL HCL 50 MG PO TABS: 50 MG | ORAL | Qty: 2

## 2021-02-26 MED FILL — OXYCODONE-ACETAMINOPHEN 5-325 MG PO TABS: 5-325 MG | ORAL | Qty: 2

## 2021-02-26 NOTE — Plan of Care (Signed)
Problem: Breathing Pattern - Ineffective:  Goal: Ability to achieve and maintain a regular respiratory rate will improve  Description: Ability to achieve and maintain a regular respiratory rate will improve  02/26/2021 0857 by Gaylyn Cheers, RN  Outcome: Ongoing   Oxygen administered as needed. Pulse oximetry levels WNL. No cyanosis noted. Lung sounds diminished only in the RUL where pneumothorax is present.  Repositioned to encourage proper ventilation.

## 2021-02-26 NOTE — Progress Notes (Signed)
[facility]  Office: [phone]  Ardelle Anton, DO, Susy Frizzle, DO, Freddi Che, DO, Tinnie Gens Blood, DO, Salome Arnt, MD, Lowry Bowl, MD, Arnold Long, MD, Chad Cordial, MD, Minus Breeding, MD, Seabron Spates, MD, Yolanda Bonine, MD, Silvio Clayman, DO, Juleen Starr, DO, Ree Shay, MD,  Orion Modest, DO, Dinah Beers, MD, Jeanmarie Plant, MD, Tiney Rouge, MD, Burman Nieves, DO, Wilnette Kales, MD, Durwin Glaze, MD, Carlena Hurl, CNP, [facility] DelGrosso, CNP, Marylin Crosby, CNP, Reola Mosher, CNS, Erick Alley, CNP, Ival Bible, CNP, Dereck Ligas, CNP, Luberta Mutter, CNP, Henry Russel, CNP, Luna Glasgow, PA-C, Bess Kinds, DNP, Minna Antis, DNP, Farrel Gordon, CNP, Glennie Hawk, CNP, Ileene Patrick, CNP, Franklyn Lor, CNP, Hulen Shouts, CNP, Rikki Spearing, CNP          [facility]   [facility]    Progress Note    02/26/2021    10:02 AM    Name:   Jack Harrington  MRN:     [id number]     Acct:      [id number]   Room:   [id number]  IP Day:  3  Admit Date:  02/22/2021 10:51 PM    PCP:   No primary care provider on file.  Code Status:  Full Code    Subjective:     C/C:   Chief Complaint   Patient presents with   ??? Chest Pain   ??? Cough     Interval History Status: not changed.     Increased pain after CT exchange.  Denies cough, sputum production, fevers or chills, nausea or vomiting or anginal type chest pain    Brief History:     3/15 - Patient reports that he was at work and had the sensation of a sneeze coming. ??Patient held his sneeze by holding his breath and shortly thereafter he developed severe stabbing chest pain rated at an 8/10 with a cough and shortness of breath. ??Patient reported to the emergency department for evaluation. ??In the emergency department patient was found to have a moderate pneumothorax to the right. ??Patient is a very tall and thin white male putting him at an increased risk for spontaneous pneumothorax. ??Patient  also has a known history of e-cigarette use. ??Emergency department placed a small caliber chest tube and did evacuate the pneumothorax. ??During my exam today patient is resting comfortably and his only complaint is mild pain at the insertion site. ??Patient is on wall suction and we have requested cardiothoracic to evaluate the patient as well.  ??  3/16 - Patient reports significant improvement in respiratory status.  He denies any chest pain except at the insertion site of the chest tube.  Patient is highly motivated for discharge.  Repeat chest x-ray was completed and did unfortunately show a recurrence of the pneumothorax.  Cardiothoracic is aware and we await their direction.  ??  3/17 -condition essentially unchanged.  Chest tube was clamped last night and unfortunately patient did experience a recurrence of his pneumothorax.  CT surgery plans for OR later today.  Patient kept n.p.o.  All questions answered and patient is agreeable.     Review of Systems:     Constitutional:  negative for chills, fevers, sweats  Respiratory:  negative for cough, dyspnea on exertion, shortness of breath, wheezing  Cardiovascular:  negative for chest pain, chest pressure/discomfort, lower extremity edema, palpitations  Gastrointestinal:  negative for abdominal pain, constipation, diarrhea, nausea, vomiting  Neurological:  negative for dizziness, headache  Medications:     Allergies:  No Known Allergies    Current Meds:   Scheduled Meds:   ??? sodium chloride flush  5-40 mL IntraVENous 2 times per day   ??? enoxaparin  40 mg SubCUTAneous Daily     Continuous Infusions:   ??? sodium chloride       PRN Meds: sodium chloride flush, sodium chloride, ondansetron **OR** ondansetron, polyethylene glycol, acetaminophen **OR** acetaminophen, albuterol, ipratropium-albuterol    Data:     Past Medical History:   has no past medical history on file.    Social History:   reports that he has been smoking e-cigarettes. He has never used smokeless  tobacco. He reports current alcohol use. He reports previous drug use. Drug: Marijuana Sheran Fava).     Family History:   Family History   Problem Relation Age of Onset   ??? No Known Problems Mother    ??? No Known Problems Father        Vitals:  BP 136/82    Pulse 77    Temp 97.3 ??F (36.3 ??C) (Oral)    Resp 14    Ht 6' (1.829 m)    Wt 131 lb 1 oz (59.4 kg)    SpO2 96%    BMI 17.78 kg/m??   Temp (24hrs), Avg:97.6 ??F (36.4 ??C), Min:97.3 ??F (36.3 ??C), Max:97.9 ??F (36.6 ??C)    No results for input(s): POCGLU in the last 72 hours.    I/O (24Hr):    Intake/Output Summary (Last 24 hours) at 02/26/2021 1002  Last data filed at 02/25/2021 1702  Gross per 24 hour   Intake --   Output 0 ml   Net 0 ml       Labs:  Hematology:  Recent Labs     02/25/21  1235   WBC 4.8   RBC 5.34   HGB 15.8   HCT 46.2   MCV 86.5   MCH 29.6   MCHC 34.2   RDW 12.2   PLT 217   MPV 10.5   INR 1.0     Chemistry:  Recent Labs     02/24/21  0528 02/25/21  1235   NA 139 140   K 4.7 4.2   CL 106 104   CO2 26 24   GLUCOSE 103* 87   BUN 10 14   CREATININE 0.94 0.86   ANIONGAP 7* 12   LABGLOM >60 >60   GFRAA >60 >60   CALCIUM 9.7 9.8   No results for input(s): PROT, LABALBU, LABA1C, T3TOTAL, T4TOTAL, FT4, TSH, AST, ALT, LDH, GGT, ALKPHOS, LABGGT, BILITOT, BILIDIR, AMMONIA, AMYLASE, LIPASE, LACTATE, CHOL, HDL, LDLCHOLESTEROL, CHOLHDLRATIO, TRIG, VLDL, HIV12AB, PHENYTOIN, PHENYF, URICACID, POCGLU in the last 72 hours.  ABG:No results found for: POCPH, PHART, PH, POCPCO2, PCO2ART, PCO2, POCPO2, PO2ART, PO2, POCHCO3, HCO3ART, HCO3, NBEA, PBEA, BEART, BE, THGBART, THB, TCO2ART, POCO2SAT, O2SATART, O2SAT, FIO2  No results found for: SPECIAL  No results found for: CULTURE    Radiology:  CT CHEST WO CONTRAST    Result Date: 02/24/2021  1. There is a small bore right-sided chest tube noted anteriorly in the chest with a small right-sided pneumothorax.     XR CHEST PORTABLE    Result Date: 02/26/2021  1. Larger right pneumothorax now measuring 4.7 cm from the pleural surface.      XR CHEST PORTABLE    Result Date: 02/25/2021  Decrease in size of the right pneumothorax, which is now small.     XR  CHEST PORTABLE    Result Date: 02/25/2021  Re-accumulation of the moderate right pneumothorax.     XR CHEST PORTABLE    Result Date: 02/24/2021  Decrease in the size of the right pneumothorax.     XR CHEST PORTABLE    Result Date: 02/24/2021  Recurrence of the right mild to moderate pneumothorax.     XR CHEST PORTABLE    Result Date: 02/23/2021  1. Resolved right pneumothorax following right chest tube placement.     XR CHEST PORTABLE    Addendum Date: 02/22/2021    ADDENDUM: This will assist with Dr. Cheri Fowler PA at 11:33 p.m.     Result Date: 02/22/2021  Moderate right pneumothorax       Physical Examination:        General appearance:  alert, cooperative and no distress  Mental Status:  oriented to person, place and time and normal affect  Lungs:  clear to auscultation bilaterally, normal effort  Heart:  regular rate and rhythm, no murmur  Abdomen:  soft, nontender, nondistended, normal bowel sounds, no masses, hepatomegaly, splenomegaly  Extremities:  no edema, redness, tenderness in the calves  Skin:  no gross lesions, rashes, induration    Assessment:        Hospital Problems           Last Modified POA    * (Principal) Primary spontaneous pneumothorax 02/23/2021 Yes    Nicotine dependence, uncomplicated 02/23/2021 Yes          Plan:        1. Chest tube exchanged today.  2. CT to suction  3. Follow-up chest x-ray in the morning  4. Thoracic surgery eval  5. GI & DVT prophylaxis  6. Tobacco cesaation    Leanora Ivanoff, DO  02/26/2021  10:02 AM

## 2021-02-26 NOTE — OR Nursing (Signed)
Time out performed.

## 2021-02-26 NOTE — Plan of Care (Signed)
Patient to IR today for a larger bore chest tube. Will keep chest tube on strict suction for the next few days.

## 2021-02-26 NOTE — Brief Op Note (Signed)
Brief Postoperative Note    Esa Raden  Date of Birth:  08/11/2001  [id number]    Pre-operative Diagnosis: Recurrent right pneumothorax    Post-operative Diagnosis: Same    Procedure: Upsize to a right 12 Fr pleural drain    Anesthesia: Local    Surgeons/Assistants: Brayson Livesey    Estimated Blood Loss: less than 50     Complications: None    Specimens: Was Not Obtained    Electronically signed by Bary Richard, MD on 02/26/2021 at 10:56 AM

## 2021-02-26 NOTE — OR Nursing (Signed)
Procedure end

## 2021-02-26 NOTE — OR Nursing (Signed)
Procedure start

## 2021-02-27 ENCOUNTER — Inpatient Hospital Stay: Payer: BLUE CROSS/BLUE SHIELD

## 2021-02-27 ENCOUNTER — Inpatient Hospital Stay: Admit: 2021-02-27 | Payer: BLUE CROSS/BLUE SHIELD

## 2021-02-27 MED FILL — LOVENOX 40 MG/0.4ML SC SOLN: 40 MG/0.4ML | SUBCUTANEOUS | Qty: 0.4

## 2021-02-27 NOTE — Plan of Care (Signed)
Problem: Falls - Risk of:  Goal: Will remain free from falls  Description: Will remain free from falls  Outcome: Ongoing     Problem: Falls - Risk of:  Goal: Absence of physical injury  Description: Absence of physical injury  Outcome: Ongoing     Problem: Breathing Pattern - Ineffective:  Goal: Ability to achieve and maintain a regular respiratory rate will improve  Description: Ability to achieve and maintain a regular respiratory rate will improve  Outcome: Ongoing

## 2021-02-27 NOTE — Progress Notes (Signed)
[facility]  Office: [phone]  Ardelle Anton, DO, Susy Frizzle, DO, Freddi Che, DO, Tinnie Gens Kolin Erdahl, DO, Salome Arnt, MD, Lowry Bowl, MD, Arnold Long, MD, Chad Cordial, MD, Minus Breeding, MD, Seabron Spates, MD, Yolanda Bonine, MD, Silvio Clayman, DO, Juleen Starr, DO, Ree Shay, MD,  Orion Modest, DO, Dinah Beers, MD, Jeanmarie Plant, MD, Tiney Rouge, MD, Burman Nieves, DO, Wilnette Kales, MD, Durwin Glaze, MD, Carlena Hurl, CNP, [facility] DelGrosso, CNP, Marylin Crosby, CNP, Reola Mosher, CNS, Erick Alley, CNP, Ival Bible, CNP, Dereck Ligas, CNP, Luberta Mutter, CNP, Henry Russel, CNP, Luna Glasgow, PA-C, Bess Kinds, DNP, Minna Antis, DNP, Farrel Gordon, CNP, Glennie Hawk, CNP, Ileene Patrick, CNP, Franklyn Lor, CNP, Hulen Shouts, CNP, Rikki Spearing, CNP         [facility]   [facility]    Progress Note    02/27/2021    10:41 AM    Name:   Jack Harrington  MRN:     [id number]     Acct:      [id number]   Room:   [id number]  IP Day:  4  Admit Date:  02/22/2021 10:51 PM    PCP:   No primary care provider on file.  Code Status:  Full Code    Subjective:     C/C:   Chief Complaint   Patient presents with   ??? Chest Pain   ??? Cough     Interval History Status: improved.     Overall feels better but has a fair amount of pain at new chest tube site: was upsized yesterday by IR    denies n/v    Brief History:     Per my APP:  "Jack Harrington is a 20 y.o. Non-hispanic / non latino male who presents with Chest Pain and Cough   and is admitted to the hospital for the management of Primary spontaneous pneumothorax.  ??  Patient reports that he was at work and had the sensation of a sneeze coming.  Patient held his sneeze by holding his breath and shortly thereafter he developed severe stabbing chest pain rated at an 8/10 with a cough and shortness of breath.  Patient reported to the emergency department for evaluation.  In the emergency department  patient was found to have a moderate pneumothorax to the right.  Patient is a very tall and thin white male putting him at an increased risk for spontaneous pneumothorax.  Patient also has a known history of e-cigarette use.  Emergency department placed a small caliber chest tube and did evacuate the pneumothorax."    Had recurrence of pneumothorax when chest tube taken off suction on 3/16    Had larger bore chest tube placed by IR on 3/18    Review of Systems:     Constitutional:  negative for chills, fevers, sweats  Respiratory:  negative for cough,wheezing  Cardiovascular:  negative for lower extremity edema, palpitations  Gastrointestinal:  negative for abdominal pain, constipation, diarrhea, nausea, vomiting  Neurological:  negative for dizziness, headache    Medications:     Allergies:  No Known Allergies    Current Meds:   Scheduled Meds:   ??? sodium chloride flush  5-40 mL IntraVENous 2 times per day   ??? enoxaparin  40 mg SubCUTAneous Daily     Continuous Infusions:   ??? sodium chloride       PRN Meds: traMADol **OR** traMADol, sodium chloride flush, sodium chloride, ondansetron **OR**  ondansetron, polyethylene glycol, acetaminophen **OR** acetaminophen, albuterol, ipratropium-albuterol    Data:     Past Medical History:   has no past medical history on file.    Social History:   reports that he has been smoking e-cigarettes. He has never used smokeless tobacco. He reports current alcohol use. He reports previous drug use. Drug: Marijuana Sheran Fava).     Family History:   Family History   Problem Relation Age of Onset   ??? No Known Problems Mother    ??? No Known Problems Father        Vitals:  BP 134/85    Pulse 79    Temp 98.1 ??F (36.7 ??C) (Oral)    Resp 12    Ht 6' (1.829 m)    Wt 131 lb 1 oz (59.4 kg)    SpO2 95%    BMI 17.78 kg/m??   Temp (24hrs), Avg:97.7 ??F (36.5 ??C), Min:97.3 ??F (36.3 ??C), Max:98.1 ??F (36.7 ??C)    No results for input(s): POCGLU in the last 72 hours.    I/O (24Hr):    Intake/Output Summary  (Last 24 hours) at 02/27/2021 1041  Last data filed at 02/27/2021 0906  Gross per 24 hour   Intake 300 ml   Output --   Net 300 ml       Labs:  Hematology:  Recent Labs     02/25/21  1235   WBC 4.8   RBC 5.34   HGB 15.8   HCT 46.2   MCV 86.5   MCH 29.6   MCHC 34.2   RDW 12.2   PLT 217   MPV 10.5   INR 1.0     Chemistry:  Recent Labs     02/25/21  1235   NA 140   K 4.2   CL 104   CO2 24   GLUCOSE 87   BUN 14   CREATININE 0.86   ANIONGAP 12   LABGLOM >60   GFRAA >60   CALCIUM 9.8   No results for input(s): PROT, LABALBU, LABA1C, T3TOTAL, T4TOTAL, FT4, TSH, AST, ALT, LDH, GGT, ALKPHOS, LABGGT, BILITOT, BILIDIR, AMMONIA, AMYLASE, LIPASE, LACTATE, CHOL, HDL, LDLCHOLESTEROL, CHOLHDLRATIO, TRIG, VLDL, HIV12AB, PHENYTOIN, PHENYF, URICACID, POCGLU in the last 72 hours.  ABG:No results found for: POCPH, PHART, PH, POCPCO2, PCO2ART, PCO2, POCPO2, PO2ART, PO2, POCHCO3, HCO3ART, HCO3, NBEA, PBEA, BEART, BE, THGBART, THB, TCO2ART, POCO2SAT, O2SATART, O2SAT, FIO2  No results found for: SPECIAL  No results found for: CULTURE    Radiology:  CT CHEST WO CONTRAST    Result Date: 02/24/2021  1. There is a small bore right-sided chest tube noted anteriorly in the chest with a small right-sided pneumothorax.     XR CHEST PORTABLE    Result Date: 02/27/2021  1. Stable trace right apical pneumothorax.  Right apical pigtail catheter chest tube stable in position. 2. No acute focal airspace consolidation.     XR CHEST PORTABLE    Result Date: 02/26/2021  Status post upsize and repositioning of the right pleural drain with nearly resolved trace right pneumothorax with 3 mm pleuroparenchymal gap remaining.     XR CHEST PORTABLE    Result Date: 02/26/2021  1. Larger right pneumothorax now measuring 4.7 cm from the pleural surface.     XR CHEST PORTABLE    Result Date: 02/25/2021  Decrease in size of the right pneumothorax, which is now small.     XR CHEST PORTABLE    Result Date: 02/25/2021  Re-accumulation of  the moderate right pneumothorax.     XR  CHEST PORTABLE    Result Date: 02/24/2021  Decrease in the size of the right pneumothorax.     XR CHEST PORTABLE    Result Date: 02/24/2021  Recurrence of the right mild to moderate pneumothorax.     XR CHEST PORTABLE    Result Date: 02/23/2021  1. Resolved right pneumothorax following right chest tube placement.     XR CHEST PORTABLE    Addendum Date: 02/22/2021    ADDENDUM: This will assist with Dr. Cheri Fowler PA at 11:33 p.m.     Result Date: 02/22/2021  Moderate right pneumothorax     IR CHEST TUBE INSERTION    Result Date: 02/26/2021  Successful upsize and placement of a 12 French right pleural drain.       Physical Examination:        General appearance:  alert, cooperative and no distress  Mental Status:  oriented to person, place and time and normal affect  Lungs:  clear to auscultation bilaterally, normal effort  Heart:  regular rate and rhythm, no murmur  Abdomen:  soft, nontender, nondistended, normal bowel sounds, no masses, hepatomegaly, splenomegaly  Extremities:  no edema, redness, tenderness in the calves; thin  Skin:  no gross lesions, rashes, induration    Assessment:        Hospital Problems           Last Modified POA    * (Principal) Primary spontaneous pneumothorax 02/23/2021 Yes    Nicotine dependence, uncomplicated 02/23/2021 Yes          Plan:        1. Chest tube to suction  2. Tobacco cessation  3. Monitor for worsening pneumothorax or hypoxia    Simon Aaberg P Taiyana Kissler, DO  02/27/2021  10:41 AM

## 2021-02-27 NOTE — Plan of Care (Signed)
Problem: Falls - Risk of:  Goal: Will remain free from falls  Description: Will remain free from falls  02/27/2021 2206 by Ardyth Harps, RN  Outcome: Ongoing     Problem: Breathing Pattern - Ineffective:  Goal: Ability to achieve and maintain a regular respiratory rate will improve  Description: Ability to achieve and maintain a regular respiratory rate will improve  02/27/2021 2206 by Ardyth Harps, RN  Outcome: Ongoing

## 2021-02-27 NOTE — Plan of Care (Signed)
Problem: Falls - Risk of:  Goal: Will remain free from falls  Description: Will remain free from falls.Fall risk assessment completed. Patient instructed to use call light. Bed locked and in lowest position, side rails up 2/4, call light and bedside table within reach, clutter removed, and non-skid footwear on when pt out of bed. Hourly rounds will continue.   02/27/2021 1452 by Ileene Patrick, RN  Outcome: Ongoing     Problem: Breathing Pattern - Ineffective:  Goal: Ability to achieve and maintain a regular respiratory rate will improve  Description: Ability to achieve and maintain a regular respiratory rate will improve  02/27/2021 1452 by Ileene Patrick, RN  Outcome: Ongoing

## 2021-02-27 NOTE — Plan of Care (Signed)
Continue chest tube to suction    Erskine Emery, PA

## 2021-02-28 ENCOUNTER — Inpatient Hospital Stay: Admit: 2021-02-28 | Payer: BLUE CROSS/BLUE SHIELD

## 2021-02-28 MED FILL — TRAMADOL HCL 50 MG PO TABS: 50 MG | ORAL | Qty: 1

## 2021-02-28 NOTE — Plan of Care (Signed)
Problem: Breathing Pattern - Ineffective:  Goal: Ability to achieve and maintain a regular respiratory rate will improve  Description: Ability to achieve and maintain a regular respiratory rate will improve  Outcome: Ongoing     Problem: Falls - Risk of:  Goal: Absence of physical injury  Description: Absence of physical injury  Outcome: Ongoing     Problem: Skin Integrity:  Goal: Will show no infection signs and symptoms  Description: Will show no infection signs and symptoms  Outcome: Ongoing

## 2021-02-28 NOTE — Progress Notes (Signed)
Patient transferred to 2036 on continuous suction. All belongings with patient.

## 2021-02-28 NOTE — Progress Notes (Signed)
[facility]  Office: [phone]  Ardelle Anton, DO, Susy Frizzle, DO, Freddi Che, DO, Tinnie Gens Jaben Benegas, DO, Salome Arnt, MD, Lowry Bowl, MD, Arnold Long, MD, Chad Cordial, MD, Minus Breeding, MD, Seabron Spates, MD, Yolanda Bonine, MD, Silvio Clayman, DO, Juleen Starr, DO, Ree Shay, MD,  Orion Modest, DO, Dinah Beers, MD, Jeanmarie Plant, MD, Tiney Rouge, MD, Burman Nieves, DO, Wilnette Kales, MD, Durwin Glaze, MD, Carlena Hurl, CNP, [facility] DelGrosso, CNP, Marylin Crosby, CNP, Reola Mosher, CNS, Erick Alley, CNP, Ival Bible, CNP, Dereck Ligas, CNP, Luberta Mutter, CNP, Henry Russel, CNP, Luna Glasgow, PA-C, Bess Kinds, DNP, Minna Antis, DNP, Farrel Gordon, CNP, Glennie Hawk, CNP, Ileene Patrick, CNP, Franklyn Lor, CNP, Hulen Shouts, CNP, Rikki Spearing, CNP         [facility]   [facility] - [facility]    Progress Note    02/28/2021    11:38 AM    Name:   Jack Harrington  MRN:     [id number]     Acct:      [id number]   Room:   [id number]  IP Day:  5  Admit Date:  02/22/2021 10:51 PM    PCP:   No primary care provider on file.  Code Status:  Full Code    Subjective:     C/C:   Chief Complaint   Patient presents with   ??? Chest Pain   ??? Cough     Interval History Status: improved.     Overall feels better; much less pain at chest tube site    denies n/v    Brief History:     Per my APP:  "Jack Harrington is a 20 y.o. Non-hispanic / non latino male who presents with Chest Pain and Cough   and is admitted to the hospital for the management of Primary spontaneous pneumothorax.  ??  Patient reports that he was at work and had the sensation of a sneeze coming.  Patient held his sneeze by holding his breath and shortly thereafter he developed severe stabbing chest pain rated at an 8/10 with a cough and shortness of breath.  Patient reported to the emergency department for evaluation.  In the emergency department patient was found to have a moderate pneumothorax  to the right.  Patient is a very tall and thin white male putting him at an increased risk for spontaneous pneumothorax.  Patient also has a known history of e-cigarette use.  Emergency department placed a small caliber chest tube and did evacuate the pneumothorax."    Had recurrence of pneumothorax when chest tube taken off suction on 3/16    Had larger bore chest tube placed by IR on 3/18    Review of Systems:     Constitutional:  negative for chills, fevers, sweats  Respiratory:  negative for cough,wheezing  Cardiovascular:  negative for lower extremity edema, palpitations  Gastrointestinal:  negative for abdominal pain, constipation, diarrhea, nausea, vomiting  Neurological:  negative for dizziness, headache    Medications:     Allergies:  No Known Allergies    Current Meds:   Scheduled Meds:   ??? sodium chloride flush  5-40 mL IntraVENous 2 times per day   ??? enoxaparin  40 mg SubCUTAneous Daily     Continuous Infusions:   ??? sodium chloride       PRN Meds: traMADol **OR** traMADol, sodium chloride flush, sodium chloride, ondansetron **OR** ondansetron, polyethylene glycol, acetaminophen **OR** acetaminophen, albuterol, ipratropium-albuterol  Data:     Past Medical History:   has no past medical history on file.    Social History:   reports that he has been smoking e-cigarettes. He has never used smokeless tobacco. He reports current alcohol use. He reports previous drug use. Drug: Marijuana Sheran Fava).     Family History:   Family History   Problem Relation Age of Onset   ??? No Known Problems Mother    ??? No Known Problems Father        Vitals:  BP 115/72    Pulse 66    Temp 97.9 ??F (36.6 ??C) (Oral)    Resp 16    Ht 6' (1.829 m)    Wt 131 lb 1 oz (59.4 kg)    SpO2 96%    BMI 17.78 kg/m??   Temp (24hrs), Avg:97.9 ??F (36.6 ??C), Min:97.7 ??F (36.5 ??C), Max:98.1 ??F (36.7 ??C)    No results for input(s): POCGLU in the last 72 hours.    I/O (24Hr):    Intake/Output Summary (Last 24 hours) at 02/28/2021 1138  Last data filed at  02/27/2021 1450  Gross per 24 hour   Intake 250 ml   Output --   Net 250 ml       Labs:  Hematology:  Recent Labs     02/25/21  1235   WBC 4.8   RBC 5.34   HGB 15.8   HCT 46.2   MCV 86.5   MCH 29.6   MCHC 34.2   RDW 12.2   PLT 217   MPV 10.5   INR 1.0     Chemistry:  Recent Labs     02/25/21  1235   NA 140   K 4.2   CL 104   CO2 24   GLUCOSE 87   BUN 14   CREATININE 0.86   ANIONGAP 12   LABGLOM >60   GFRAA >60   CALCIUM 9.8   No results for input(s): PROT, LABALBU, LABA1C, T3TOTAL, T4TOTAL, FT4, TSH, AST, ALT, LDH, GGT, ALKPHOS, LABGGT, BILITOT, BILIDIR, AMMONIA, AMYLASE, LIPASE, LACTATE, CHOL, HDL, LDLCHOLESTEROL, CHOLHDLRATIO, TRIG, VLDL, HIV12AB, PHENYTOIN, PHENYF, URICACID, POCGLU in the last 72 hours.  ABG:No results found for: POCPH, PHART, PH, POCPCO2, PCO2ART, PCO2, POCPO2, PO2ART, PO2, POCHCO3, HCO3ART, HCO3, NBEA, PBEA, BEART, BE, THGBART, THB, TCO2ART, POCO2SAT, O2SATART, O2SAT, FIO2  No results found for: SPECIAL  No results found for: CULTURE    Radiology:  CT CHEST WO CONTRAST    Result Date: 02/24/2021  1. There is a small bore right-sided chest tube noted anteriorly in the chest with a small right-sided pneumothorax.     XR CHEST PORTABLE    Result Date: 02/27/2021  1. Stable trace right apical pneumothorax.  Right apical pigtail catheter chest tube stable in position. 2. No acute focal airspace consolidation.     XR CHEST PORTABLE    Result Date: 02/26/2021  Status post upsize and repositioning of the right pleural drain with nearly resolved trace right pneumothorax with 3 mm pleuroparenchymal gap remaining.     XR CHEST PORTABLE    Result Date: 02/26/2021  1. Larger right pneumothorax now measuring 4.7 cm from the pleural surface.     XR CHEST PORTABLE    Result Date: 02/25/2021  Decrease in size of the right pneumothorax, which is now small.     XR CHEST PORTABLE    Result Date: 02/25/2021  Re-accumulation of the moderate right pneumothorax.     XR CHEST PORTABLE  Result Date: 02/24/2021  Decrease in  the size of the right pneumothorax.     XR CHEST PORTABLE    Result Date: 02/24/2021  Recurrence of the right mild to moderate pneumothorax.     XR CHEST PORTABLE    Result Date: 02/23/2021  1. Resolved right pneumothorax following right chest tube placement.     XR CHEST PORTABLE    Addendum Date: 02/22/2021    ADDENDUM: This will assist with Dr. Cheri Fowler PA at 11:33 p.m.     Result Date: 02/22/2021  Moderate right pneumothorax     IR CHEST TUBE INSERTION    Result Date: 02/26/2021  Successful upsize and placement of a 12 French right pleural drain.       Physical Examination:        General appearance:  alert, cooperative and no distress  Mental Status:  oriented to person, place and time and normal affect  Lungs:  clear to auscultation bilaterally, normal effort  Heart:  regular rate and rhythm, no murmur  Abdomen:  soft, nontender, nondistended, normal bowel sounds, no masses, hepatomegaly, splenomegaly  Extremities:  no edema, redness, tenderness in the calves; thin  Skin:  no gross lesions, rashes, induration    Assessment:        Hospital Problems           Last Modified POA    * (Principal) Primary spontaneous pneumothorax 02/23/2021 Yes    Nicotine dependence, uncomplicated 02/23/2021 Yes          Plan:        1. Chest tube to suction  2. Tobacco cessation  3. Monitor for worsening pneumothorax or hypoxia    Hassan Buckler Heloise Gordan, DO  02/28/2021  11:38 AM

## 2021-02-28 NOTE — Plan of Care (Signed)
Problem: Falls - Risk of:  Goal: Will remain free from falls  Description: Will remain free from falls.Fall risk assessment completed. Patient instructed to use call light. Bed locked and in lowest position, side rails up 2/4, call light and bedside table within reach, clutter removed, and non-skid footwear on when pt out of bed. Hourly rounds will continue.   Outcome: Ongoing     Problem: Breathing Pattern - Ineffective:  Goal: Ability to achieve and maintain a regular respiratory rate will improve  Description: Ability to achieve and maintain a regular respiratory rate will improve  02/28/2021 1410 by Ileene Patrick, RN  Outcome: Ongoing

## 2021-03-01 ENCOUNTER — Inpatient Hospital Stay: Admit: 2021-03-01 | Payer: BLUE CROSS/BLUE SHIELD

## 2021-03-01 NOTE — Progress Notes (Signed)
[facility]  Office: [phone]  Ardelle Anton, DO, Susy Frizzle, DO, Freddi Che, DO, Tinnie Gens Graycie Halley, DO, Salome Arnt, MD, Lowry Bowl, MD, Arnold Long, MD, Chad Cordial, MD, Minus Breeding, MD, Seabron Spates, MD, Yolanda Bonine, MD, Silvio Clayman, DO, Juleen Starr, DO, Ree Shay, MD,  Orion Modest, DO, Dinah Beers, MD, Jeanmarie Plant, MD, Tiney Rouge, MD, Burman Nieves, DO, Wilnette Kales, MD, Durwin Glaze, MD, Carlena Hurl, CNP, [facility] DelGrosso, CNP, Marylin Crosby, CNP, Reola Mosher, CNS, Erick Alley, CNP, Ival Bible, CNP, Dereck Ligas, CNP, Luberta Mutter, CNP, Henry Russel, CNP, Luna Glasgow, PA-C, Bess Kinds, DNP, Minna Antis, DNP, Farrel Gordon, CNP, Glennie Hawk, CNP, Ileene Patrick, CNP, Franklyn Lor, CNP, Hulen Shouts, CNP, Rikki Spearing, CNP         [facility]   [facility] - [facility]    Progress Note    03/01/2021    8:55 AM    Name:   Jack Harrington  MRN:     [id number]     Acct:      [id number]   Room:   [id number]  IP Day:  6  Admit Date:  02/22/2021 10:51 PM    PCP:   No primary care provider on file.  Code Status:  Full Code    Subjective:     C/C:   Chief Complaint   Patient presents with   ??? Chest Pain   ??? Cough     Interval History Status: improved.     Overall feels better; no pain at chest tube site    denies n/v  Not sob    Brief History:     Per my APP:  "Harrol Novello is a 20 y.o. Non-hispanic / non latino male who presents with Chest Pain and Cough   and is admitted to the hospital for the management of Primary spontaneous pneumothorax.  ??  Patient reports that he was at work and had the sensation of a sneeze coming.  Patient held his sneeze by holding his breath and shortly thereafter he developed severe stabbing chest pain rated at an 8/10 with a cough and shortness of breath.  Patient reported to the emergency department for evaluation.  In the emergency department patient was found to have a moderate pneumothorax  to the right.  Patient is a very tall and thin white male putting him at an increased risk for spontaneous pneumothorax.  Patient also has a known history of e-cigarette use.  Emergency department placed a small caliber chest tube and did evacuate the pneumothorax."    Had recurrence of pneumothorax when chest tube taken off suction on 3/16    Had larger bore chest tube placed by IR on 3/18    Review of Systems:     Constitutional:  negative for chills, fevers, sweats  Respiratory:  negative for cough,wheezing, sob, doe  Cardiovascular:  negative for lower extremity edema, palpitations  Gastrointestinal:  negative for abdominal pain, constipation, diarrhea, nausea, vomiting  Neurological:  negative for dizziness, headache    Medications:     Allergies:  No Known Allergies    Current Meds:   Scheduled Meds:   ??? sodium chloride flush  5-40 mL IntraVENous 2 times per day   ??? enoxaparin  40 mg SubCUTAneous Daily     Continuous Infusions:   ??? sodium chloride       PRN Meds: traMADol **OR** traMADol, sodium chloride flush, sodium chloride, ondansetron **OR** ondansetron, polyethylene glycol, acetaminophen **OR** acetaminophen,  albuterol, ipratropium-albuterol    Data:     Past Medical History:   has no past medical history on file.    Social History:   reports that he has been smoking e-cigarettes. He has never used smokeless tobacco. He reports current alcohol use. He reports previous drug use. Drug: Marijuana Sheran Fava).     Family History:   Family History   Problem Relation Age of Onset   ??? No Known Problems Mother    ??? No Known Problems Father        Vitals:  BP 105/71    Pulse 75    Temp 98.3 ??F (36.8 ??C) (Temporal)    Resp 18    Ht 6' (1.829 m)    Wt 131 lb 1 oz (59.4 kg)    SpO2 97%    BMI 17.78 kg/m??   Temp (24hrs), Avg:98.1 ??F (36.7 ??C), Min:97.9 ??F (36.6 ??C), Max:98.3 ??F (36.8 ??C)    No results for input(s): POCGLU in the last 72 hours.    I/O (24Hr):    Intake/Output Summary (Last 24 hours) at 03/01/2021 0855  Last  data filed at 02/28/2021 1410  Gross per 24 hour   Intake --   Output 0 ml   Net 0 ml       Labs:  Hematology:  No results for input(s): WBC, RBC, HGB, HCT, MCV, MCH, MCHC, RDW, PLT, MPV, SEDRATE, CRP, INR, DDIMER, CD4TCELL, LABABSO in the last 72 hours.    Invalid input(s): PT  Chemistry:  No results for input(s): NA, K, CL, CO2, GLUCOSE, BUN, CREATININE, MG, ANIONGAP, LABGLOM, GFRAA, CALCIUM, CAION, PHOS, PSA, PROBNP, TROPHS, CKTOTAL, CKMB, CKMBINDEX, MYOGLOBIN, DIGOXIN, LACTACIDWB in the last 72 hours.No results for input(s): PROT, LABALBU, LABA1C, T3TOTAL, T4TOTAL, FT4, TSH, AST, ALT, LDH, GGT, ALKPHOS, LABGGT, BILITOT, BILIDIR, AMMONIA, AMYLASE, LIPASE, LACTATE, CHOL, HDL, LDLCHOLESTEROL, CHOLHDLRATIO, TRIG, VLDL, HIV12AB, PHENYTOIN, PHENYF, URICACID, POCGLU in the last 72 hours.  ABG:No results found for: POCPH, PHART, PH, POCPCO2, PCO2ART, PCO2, POCPO2, PO2ART, PO2, POCHCO3, HCO3ART, HCO3, NBEA, PBEA, BEART, BE, THGBART, THB, TCO2ART, POCO2SAT, O2SATART, O2SAT, FIO2  No results found for: SPECIAL  No results found for: CULTURE    Radiology:  CT CHEST WO CONTRAST    Result Date: 02/24/2021  1. There is a small bore right-sided chest tube noted anteriorly in the chest with a small right-sided pneumothorax.     XR CHEST PORTABLE    Result Date: 02/27/2021  1. Stable trace right apical pneumothorax.  Right apical pigtail catheter chest tube stable in position. 2. No acute focal airspace consolidation.     XR CHEST PORTABLE    Result Date: 02/26/2021  Status post upsize and repositioning of the right pleural drain with nearly resolved trace right pneumothorax with 3 mm pleuroparenchymal gap remaining.     XR CHEST PORTABLE    Result Date: 02/26/2021  1. Larger right pneumothorax now measuring 4.7 cm from the pleural surface.     XR CHEST PORTABLE    Result Date: 02/25/2021  Decrease in size of the right pneumothorax, which is now small.     XR CHEST PORTABLE    Result Date: 02/25/2021  Re-accumulation of the moderate  right pneumothorax.     XR CHEST PORTABLE    Result Date: 02/24/2021  Decrease in the size of the right pneumothorax.     XR CHEST PORTABLE    Result Date: 02/24/2021  Recurrence of the right mild to moderate pneumothorax.     XR CHEST  PORTABLE    Result Date: 02/23/2021  1. Resolved right pneumothorax following right chest tube placement.     XR CHEST PORTABLE    Addendum Date: 02/22/2021    ADDENDUM: This will assist with Dr. Cheri Fowler PA at 11:33 p.m.     Result Date: 02/22/2021  Moderate right pneumothorax     IR CHEST TUBE INSERTION    Result Date: 02/26/2021  Successful upsize and placement of a 12 French right pleural drain.       Physical Examination:        General appearance:  alert, cooperative and no distress  Mental Status:  oriented to person, place and time and normal affect  Lungs:  clear to auscultation bilaterally, normal effort  Heart:  regular rate and rhythm, no murmur  Abdomen:  soft, nontender, nondistended, normal bowel sounds, no masses, hepatomegaly, splenomegaly  Extremities:  no edema, redness, tenderness in the calves; thin  Skin:  no gross lesions, rashes, induration    Assessment:        Hospital Problems           Last Modified POA    * (Principal) Primary spontaneous pneumothorax 02/23/2021 Yes    Nicotine dependence, uncomplicated 02/23/2021 Yes          Plan:        1. Chest tube to suction  2. Tobacco cessation  3. Monitor for worsening pneumothorax or hypoxia    Jaelani Posa P Joycie Aerts, DO  03/01/2021  8:55 AM

## 2021-03-01 NOTE — Other (Signed)
[facility] CARE DEPARTMENT - [facility]  PROGRESS NOTE    Room # 2036/2036-01   Name: Jack Harrington              Reason for visit: Routine    I visited the patient.    Admit Date & Time: 02/22/2021 10:51 PM    Assessment:  Jack Harrington is a 20 y.o. male.  Upon entering the room patient states well, states no major needs or prayers.  Patient kindly declines Spiritual Care visit. Chaplain leaves prayer card for patient for possible follow up as needed.      Intervention:  Chaplain provided a ministry presence.    Outcome:  Patient grateful for the visit.    Plan:  Chaplains will remain available to offer spiritual and emotional support as needed.    Electronically signed by Dola Argyle. Kenzli Barritt,Chaplain, on 03/01/2021 at 1:24 PM.  Spiritual Care Department  [facility] - [facility]

## 2021-03-01 NOTE — Progress Notes (Signed)
[facility]  Daily Progress Note    Subjective:  Mr. Veiga  Is tired today with no acute complaints.    Physical Exam  Vital Signs: BP 105/71    Pulse 75    Temp 98.3 ??F (36.8 ??C) (Temporal)    Resp 18    Ht 6' (1.829 m)    Wt 131 lb 1 oz (59.4 kg)    SpO2 97%    BMI 17.78 kg/m??      Admit Weight: Weight: 135 lb 1.6 oz (61.3 kg)   WEIGHTWeight: 131 lb 1 oz (59.4 kg)     General: alert and oriented to person, place and time, well-developed and well-nourished, in no acute distress.   Heart: Normal S1 and S2.  Regular rhythm. No murmurs, gallops, or rubs.    Lungs: clear to auscultation bilaterally Chest tubes: Yes, Air Leak: No   Abdomen: soft, non tender, non distended, BS x4  Extremities: negative    Scheduled Meds:   ??? sodium chloride flush  5-40 mL IntraVENous 2 times per day   ??? enoxaparin  40 mg SubCUTAneous Daily     Continuous Infusions:   ??? sodium chloride         Chest X-Ray:    FINDINGS:   Right apical pigtail chest tube remains in place. ??No appreciable   pneumothorax. ??No focal consolidation. ??No pleural effusion or pneumothorax.   Bones grossly intact.       I/O:  No intake/output data recorded.    Assessment/Plan:  History reviewed. No pertinent past medical history.  ??? Neuro: Intact  ??? Lungs: clear  ??? HD: VSS  ??? Edema: none noted  ??? GI: Active bowel sounds X4  ??? GU: Good urine output    ??? Chest x-ray daily  ??? Keep Chest Tubes to strict wall suction at -20 CM  o Keep to suction for 24-48 hours since lung is fully expanded today  ??? Encourage incentive spirometry, acapella and ambulation     The above recommendations including medications and orders were discussed and agreed upon with Dr. Suzanna Obey, the attending on service for the cardiothoracic surgery group today.     Electronically signed by Anne Shutter, APRN - CNP on 03/01/2021 at 10:19 AM    On this date 03/01/2021 I have spent 21 minutes reviewing previous notes, test results and face to face with the patient  discussing the diagnosis and importance of compliance with the treatment plan as well as documenting on the day of the visit. At least 50% of the time documented was spent with the patient to provide counseling and/or coordination of care.    This note was created with the assistance of a speech-recognition program.  Although the intention is to generate a document that actually reflects the content of the visit, no guarantees can be provided that every mistake has been identified and corrected by editing.     Note was updated later by me after  physical examination and  completion of the assessment.

## 2021-03-02 ENCOUNTER — Inpatient Hospital Stay: Admit: 2021-03-02 | Payer: BLUE CROSS/BLUE SHIELD

## 2021-03-02 MED FILL — LOVENOX 40 MG/0.4ML SC SOLN: 40 MG/0.4ML | SUBCUTANEOUS | Qty: 0.4

## 2021-03-02 NOTE — Plan of Care (Signed)
Problem: Falls - Risk of:  Goal: Will remain free from falls  Description: Will remain free from falls  Outcome: Ongoing  Goal: Absence of physical injury  Description: Absence of physical injury  Outcome: Ongoing     Problem: Breathing Pattern - Ineffective:  Goal: Ability to achieve and maintain a regular respiratory rate will improve  Description: Ability to achieve and maintain a regular respiratory rate will improve  Outcome: Ongoing     Problem: Skin Integrity:  Goal: Will show no infection signs and symptoms  Description: Will show no infection signs and symptoms  Outcome: Ongoing  Goal: Absence of new skin breakdown  Description: Absence of new skin breakdown  Outcome: Ongoing

## 2021-03-02 NOTE — Progress Notes (Signed)
[facility]  Daily Progress Note    Subjective:  [name]  Is tired today with no acute complaints.    Physical Exam  Vital Signs: BP 108/63    Pulse 61    Temp 97.6 ??F (36.4 ??C) (Temporal)    Resp 18    Ht 6' (1.829 m)    Wt 131 lb (59.4 kg)    SpO2 95%    BMI 17.77 kg/m??      Admit Weight: Weight: 135 lb 1.6 oz (61.3 kg)   WEIGHTWeight: 131 lb (59.4 kg)     General: alert and oriented to person, place and time, well-developed and well-nourished, in no acute distress.   Heart: Normal S1 and S2.  Regular rhythm. No murmurs, gallops, or rubs.    Lungs: clear to auscultation bilaterally Chest tubes: Yes, Air Leak: No   Abdomen: soft, non tender, non distended, BS x4  Extremities: negative    Scheduled Meds:   ??? sodium chloride flush  5-40 mL IntraVENous 2 times per day   ??? enoxaparin  40 mg SubCUTAneous Daily     Continuous Infusions:   ??? sodium chloride         Chest X-Ray:    FINDINGS:   Right superior pigtail pleural drainage catheter position is stable without   evidence of trace right apical pneumothorax.   ??   .   ??   The heart is normal in size and configuration. ??The mediastinal contours are   within normal limits.   ??   The lungs are well aerated. ??The pleural surfaces are normal and no evidence   of a pleural effusion is seen.   ??   Bones and soft tissues are unremarkable.     I/O:  I/O last 3 completed shifts:  In: 1560 [P.O.:1560]  Out: 0     Assessment/Plan:  History reviewed. No pertinent past medical history.  ??? Neuro: Intact  ??? Lungs: clear  ??? HD: VSS  ??? Edema: none noted  ??? GI: Active bowel sounds X4  ??? GU: Good urine output    ??? Chest x-ray daily  ??? Keep Chest Tubes to strict wall suction at -20 CM  o Keep to suction  ??? Encourage incentive spirometry, acapella and ambulation   ??? Will re-evaluate case with Dr. Suzanna Obey     The above recommendations including medications and orders were discussed and agreed upon with Dr. Ignatius Specking, the attending on service for the  cardiothoracic surgery group today.     Electronically signed by Anne Shutter, APRN - CNP on 03/02/2021 at 9:43 AM    On this date 03/02/2021 I have spent 17 minutes reviewing previous notes, test results and face to face with the patient discussing the diagnosis and importance of compliance with the treatment plan as well as documenting on the day of the visit. At least 50% of the time documented was spent with the patient to provide counseling and/or coordination of care.    This note was created with the assistance of a speech-recognition program.  Although the intention is to generate a document that actually reflects the content of the visit, no guarantees can be provided that every mistake has been identified and corrected by editing.     Note was updated later by me after  physical examination and  completion of the assessment.

## 2021-03-02 NOTE — Plan of Care (Signed)
Nutrition Problem #1: Inadequate protein-energy intake  Intervention: Food and/or Nutrient Delivery: Continue Current Diet  Nutritional Goals: PO intakes >75% at meals

## 2021-03-02 NOTE — Plan of Care (Signed)
Care Plan Note  Pt resting in bed. Denies any pain or needs at this time.     Problem: Falls - Risk of:  Goal: Will remain free from falls  Description: Will remain free from falls  03/02/2021 2239 by Ranae Palms, RN  Outcome: Ongoing  03/02/2021 1746 by Elsie Lincoln, RN  Outcome: Ongoing  Goal: Absence of physical injury  Description: Absence of physical injury  03/02/2021 2239 by Ranae Palms, RN  Outcome: Ongoing  03/02/2021 1746 by Elsie Lincoln, RN  Outcome: Ongoing     Problem: Breathing Pattern - Ineffective:  Goal: Ability to achieve and maintain a regular respiratory rate will improve  Description: Ability to achieve and maintain a regular respiratory rate will improve  03/02/2021 2239 by Ranae Palms, RN  Outcome: Ongoing  03/02/2021 1746 by Elsie Lincoln, RN  Outcome: Ongoing     Problem: Skin Integrity:  Goal: Will show no infection signs and symptoms  Description: Will show no infection signs and symptoms  03/02/2021 2239 by Ranae Palms, RN  Outcome: Ongoing  03/02/2021 1746 by Elsie Lincoln, RN  Outcome: Ongoing  Goal: Absence of new skin breakdown  Description: Absence of new skin breakdown  03/02/2021 2239 by Ranae Palms, RN  Outcome: Ongoing  03/02/2021 1746 by Elsie Lincoln, RN  Outcome: Ongoing     Problem: Nutrition  Goal: Optimal nutrition therapy  03/02/2021 2239 by Ranae Palms, RN  Outcome: Ongoing  03/02/2021 1746 by Elsie Lincoln, RN  Outcome: Ongoing

## 2021-03-02 NOTE — Plan of Care (Signed)
Problem: Falls - Risk of:  Goal: Will remain free from falls  Description: Will remain free from falls  Outcome: Ongoing  Goal: Absence of physical injury  Description: Absence of physical injury  Outcome: Ongoing     Problem: Breathing Pattern - Ineffective:  Goal: Ability to achieve and maintain a regular respiratory rate will improve  Description: Ability to achieve and maintain a regular respiratory rate will improve  Outcome: Ongoing     Problem: Skin Integrity:  Goal: Will show no infection signs and symptoms  Description: Will show no infection signs and symptoms  Outcome: Ongoing  Goal: Absence of new skin breakdown  Description: Absence of new skin breakdown  Outcome: Ongoing     Problem: Nutrition  Goal: Optimal nutrition therapy  Outcome: Ongoing

## 2021-03-02 NOTE — Progress Notes (Signed)
[facility]  Office: [phone]  Ardelle Anton, DO, Susy Frizzle, DO, Freddi Che, DO, Tinnie Gens Blood, DO, Salome Arnt, MD, Lowry Bowl, MD, Arnold Long, MD, Chad Cordial, MD, Minus Breeding, MD, Seabron Spates, MD, Yolanda Bonine, MD, Silvio Clayman, DO, Juleen Starr, DO, Ree Shay, MD,  Orion Modest, DO, Dinah Beers, MD, Jeanmarie Plant, MD, Tiney Rouge, MD, Burman Nieves, DO, Wilnette Kales, MD, Durwin Glaze, MD, Carlena Hurl, CNP, [facility] DelGrosso, CNP, Marylin Crosby, CNP, Reola Mosher, CNS, Erick Alley, CNP, Ival Bible, CNP, Dereck Ligas, CNP, Luberta Mutter, CNP, Henry Russel, CNP, Luna Glasgow, PA-C, Bess Kinds, DNP, Minna Antis, DNP, Farrel Gordon, CNP, Glennie Hawk, CNP, Ileene Patrick, CNP, Franklyn Lor, CNP, Hulen Shouts, CNP, Rikki Spearing, CNP       [facility]      Daily Progress Note     Admit Date: 02/22/2021  Bed/Room No.  2036/2036-01  Admitting Physician : Arnold Long, MD  Code Status :Full Code  Hospital Day:  LOS: 7 days   Chief Complaint:     Chief Complaint   Patient presents with   ??? Chest Pain   ??? Cough     Principal Problem:    Primary spontaneous pneumothorax  Active Problems:    Nicotine dependence, uncomplicated  Resolved Problems:    * No resolved hospital problems. *    Subjective :        Interval History/Significant events :  03/02/21    Patient is sitting comfortably.  He denies any chest pain.  Patient is breathing on room air.  He slept well last night.  Chest tube in place to suction.  Vitals - Stable afebrile  Labs/lab results -stable position of right-sided pigtail pleural drainage catheter    Nursing notes , Consults notes reviewed. Overnight events and updates discussed with Nursing staff .   Background History:         Jack Harrington is 20 y.o. male  Who was admitted to the hospital on 02/22/2021 for treatment of Primary spontaneous pneumothorax.   Patient was admitted after presenting with Chest pain and cough.   Patient was at work and had sensation of sneeze.  He held his sneeze by holding his breath and developed sudden onset of severe stabbing chest pain 8/10 with associated difficulty breathing.  Patient came to ED and was found to have moderate pneumothorax on the right. Patient is smoker with e-cigrettes  Patient is 6 feet tall with weight of 131 pound and BMI of 17.7.  Right-sided pigtail pleural drainage catheter was placed.  CT surgery was consulted.    PMH:  History reviewed. No pertinent past medical history.   Allergies: No Known Allergies   Medications :  sodium chloride flush, 5-40 mL, IntraVENous, 2 times per day  enoxaparin, 40 mg, SubCUTAneous, Daily        Review of Systems   Review of Systems   Constitutional: Negative for activity change, appetite change, fatigue, fever and unexpected weight change.   HENT: Negative for congestion, nosebleeds, rhinorrhea, sinus pressure, sneezing and voice change.    Respiratory: Negative for cough, choking, chest tightness, shortness of breath and wheezing.    Cardiovascular: Negative for chest pain, palpitations and leg swelling.   Gastrointestinal: Negative for abdominal pain, constipation, diarrhea, nausea and vomiting.   Genitourinary: Negative for difficulty urinating, dysuria, frequency, penile discharge and testicular pain.   Musculoskeletal: Negative for back pain.   Skin: Negative for rash.   Neurological: Negative for dizziness, weakness,  light-headedness and headaches.   Hematological: Does not bruise/bleed easily.   Psychiatric/Behavioral: Negative for agitation, behavioral problems, confusion, self-injury, sleep disturbance and suicidal ideas.     Objective :      Current Vitals : Temp: 97.6 ??F (36.4 ??C),  Pulse: 61, Resp: 18, BP: 108/63, SpO2: 95 %  Last 24 Hrs Vitals   Patient Vitals for the past 24 hrs:   BP Temp Temp src Pulse Resp SpO2 Weight   03/02/21 0800 -- 97.6 ??F (36.4 ??C) Temporal -- 18 -- --   03/02/21 0400 108/63 96.6 ??F (35.9 ??C) Temporal 61  18 95 % 131 lb (59.4 kg)   03/02/21 0000 (!) 109/58 97.9 ??F (36.6 ??C) Temporal 70 18 97 % --   03/01/21 2000 123/89 98.4 ??F (36.9 ??C) Temporal 73 18 98 % --   03/01/21 1615 -- 98.1 ??F (36.7 ??C) Temporal -- -- -- --   03/01/21 1200 119/77 97.4 ??F (36.3 ??C) Temporal -- -- -- --     Intake / output   03/20 1901 - 03/22 0700  In: 1560 [P.O.:1560]  Out: 0   Physical Exam:  Physical Exam  Vitals and nursing note reviewed.   Constitutional:       General: He is not in acute distress.     Appearance: He is underweight. He is not diaphoretic.   HENT:      Head: Normocephalic and atraumatic.      Nose:      Right Sinus: No maxillary sinus tenderness or frontal sinus tenderness.      Left Sinus: No maxillary sinus tenderness or frontal sinus tenderness.      Mouth/Throat:      Pharynx: No oropharyngeal exudate.   Eyes:      General: No scleral icterus.     Conjunctiva/sclera: Conjunctivae normal.      Pupils: Pupils are equal, round, and reactive to light.   Neck:      Thyroid: No thyromegaly.      Vascular: No JVD.   Cardiovascular:      Rate and Rhythm: Normal rate and regular rhythm.      Pulses:           Dorsalis pedis pulses are 2+ on the right side and 2+ on the left side.      Heart sounds: Normal heart sounds. No murmur heard.      Pulmonary:      Effort: Pulmonary effort is normal.      Breath sounds: Normal breath sounds. No wheezing or rales.   Chest:      Comments: Right-sided chest tube in place.  Abdominal:      Palpations: Abdomen is soft. There is no mass.      Tenderness: There is no abdominal tenderness.   Musculoskeletal:      Cervical back: Full passive range of motion without pain and neck supple.   Lymphadenopathy:      Head:      Right side of head: No submandibular adenopathy.      Left side of head: No submandibular adenopathy.      Cervical: No cervical adenopathy.   Skin:     General: Skin is warm.   Neurological:      Mental Status: He is alert and oriented to person, place, and time.      Motor:  No tremor.   Psychiatric:         Behavior: Behavior is cooperative.  Laboratory findings:    No results for input(s): WBC, HGB, HCT, PLT, SEDRATE, INR in the last 72 hours.    Invalid input(s): PT  No results for input(s): NA, K, CL, CO2, GLUCOSE, BUN, CREATININE, MG, CALCIUM, CAION, PHOS, PSA in the last 72 hours.  No results for input(s): PROT, LABALBU, LABA1C, T3TOTAL, T4TOTAL, FT4, TSH, AST, ALT, LDH, GGT, ALKPHOS, BILITOT, BILIDIR, AMMONIA, AMYLASE, LIPASE, LACTATE, CHOL, HDL, LDLCHOLESTEROL, CHOLHDLRATIO, TRIG, VLDL, BNP, TROPONINI, CKTOTAL, CKMB, CKMBINDEX, RF, ANA in the last 72 hours.       No results found for: SPECGRAV, PROTEINU, RBCUA, BLOODU, BACTERIA, NITRU, WBCUA, LEUKOCYTESUR    Imaging / Clinical Data :-   CT CHEST WO CONTRAST    Result Date: 02/24/2021  1. There is a small bore right-sided chest tube noted anteriorly in the chest with a small right-sided pneumothorax.     XR CHEST PORTABLE    Result Date: 03/02/2021  Stable positioning superior right sided pigtail pleural drainage catheter with suspected subtle trace right apical pneumothorax.     XR CHEST PORTABLE    Result Date: 03/01/2021  Right apical chest tube unchanged.  No appreciable pneumothorax.  No infiltrates.     XR CHEST PORTABLE    Result Date: 02/28/2021  1. Stable trace right apical pneumothorax. 2. Stable positioning of right apical pigtail catheter. 3. No acute focal airspace consolidation.     XR CHEST PORTABLE    Result Date: 02/27/2021  1. Stable trace right apical pneumothorax.  Right apical pigtail catheter chest tube stable in position. 2. No acute focal airspace consolidation.     XR CHEST PORTABLE    Result Date: 02/26/2021  Status post upsize and repositioning of the right pleural drain with nearly resolved trace right pneumothorax with 3 mm pleuroparenchymal gap remaining.     XR CHEST PORTABLE    Result Date: 02/26/2021  1. Larger right pneumothorax now measuring 4.7 cm from the pleural surface.     XR CHEST  PORTABLE    Result Date: 02/25/2021  Decrease in size of the right pneumothorax, which is now small.     XR CHEST PORTABLE    Result Date: 02/25/2021  Re-accumulation of the moderate right pneumothorax.     XR CHEST PORTABLE    Result Date: 02/24/2021  Decrease in the size of the right pneumothorax.     XR CHEST PORTABLE    Result Date: 02/24/2021  Recurrence of the right mild to moderate pneumothorax.     IR CHEST TUBE INSERTION    Result Date: 02/26/2021  Successful upsize and placement of a 12 French right pleural drain.        Clinical Course : stable  Assessment and Plan  :        1. Primary spontaneous pneumothorax - s/p right-sided pigtail pleural drainage catheter placement.  Catheter suction.  Management per CT surgery.  2. Nicotine dependence-recommend complete abstinence.    Discussed with CT surgery team.  Continue to monitor vitals , Intake / output ,  Cell count , HGB , Kidney function, oxygenation  as indicated .   Plan and updates discussed with patient ,  answers  explained to satisfaction.   Plan discussed with Staff Whittney RN     (Please note that portions of this note were completed with a voice recognition program. Efforts were made to edit the dictations but occasionally words are mis-transcribed.)      Arnold Long, MD  03/02/2021

## 2021-03-02 NOTE — Progress Notes (Signed)
Comprehensive Nutrition Assessment    Type and Reason for Visit:  Initial,RD Nutrition Re-Screen/LOS    Nutrition Recommendations/Plan:   1. Continue current Adult Diet; Regular -- encourage po intake   2. Monitor po intakes, need for ONS, weights and labs     Nutrition Assessment:  Patient assessed for LOS. Patient admission r/t primary spontaneous pneumothorax. Patient with chest tube in place (large bore on 3/18). Hx: Vape/Marijuana use.  Patient reports good appetite/intake - PO intakes ranging from 26-50% and 76-100% -- pt does not like the hospital food choices-- he reports eating good PTA. No weight loss reported. Will continue current diet and add ONS as needed.    Malnutrition Assessment:  Malnutrition Status: Insufficient data     Estimated Daily Nutrient Needs:  Energy (kcal):  2460 kcal (MSJ X 1.5); Weight Used for Energy Requirements:  Current     Protein (g):  89-100 gm protein (1.5-1.7 g/kg); Weight Used for Protein Requirements:  Current          Nutrition Related Findings:  GI: WDL. No edema. Labs reviewed.      Wounds:  Surgical Incision (chest tube)       Current Nutrition Therapies:    ADULT DIET; Regular    Anthropometric Measures:  ?? Height: 6' (182.9 cm)  ?? Current Body Weight: 131 lb (59.4 kg)   ?? Admission Body Weight: 135 lb 1.6 oz (61.3 kg) (Actual)       ?? Ideal Body Weight: 178 lbs; % Ideal Body Weight 73.6 %   ?? BMI: 17.8  ?? Adjusted Body Weight:  ; No Adjustment   ?? BMI Categories: Underweight (BMI less than 18.5)       Nutrition Diagnosis:   ?? Inadequate protein-energy intake related to inadequate protein-energy intake as evidenced by intake 26-50%,intake 51-75%    Nutrition Interventions:   Food and/or Nutrient Delivery:  Continue Current Diet  Nutrition Education/Counseling:  Education not indicated   Coordination of Nutrition Care:  Continue to monitor while inpatient    Goals:  PO intakes >75% at meals       Nutrition Monitoring and Evaluation:   Behavioral-Environmental  Outcomes:  None Identified   Food/Nutrient Intake Outcomes:  Food and Nutrient Intake  Physical Signs/Symptoms Outcomes:  Biochemical Data,Skin,Weight     Discharge Planning:    Continue current diet     Anda Kraft, RD, LD  Office Number: [phone]

## 2021-03-03 ENCOUNTER — Inpatient Hospital Stay: Admit: 2021-03-03 | Payer: BLUE CROSS/BLUE SHIELD

## 2021-03-03 MED ORDER — NORMAL SALINE FLUSH 0.9 % IV SOLN
0.9 % | Freq: Two times a day (BID) | INTRAVENOUS | Status: DC
Start: 2021-03-03 — End: 2021-03-04
  Administered 2021-03-04 (×2): via INTRAVENOUS

## 2021-03-03 MED ORDER — NORMAL SALINE FLUSH 0.9 % IV SOLN
0.9 % | INTRAVENOUS | Status: DC | PRN
Start: 2021-03-03 — End: 2021-03-04

## 2021-03-03 MED ORDER — SODIUM CHLORIDE 0.9 % IV SOLN
0.9 | INTRAVENOUS | Status: DC | PRN
Start: 2021-03-03 — End: 2021-03-04

## 2021-03-03 NOTE — Progress Notes (Signed)
Jack Harrington rounding on patient . Chest tube placed on water seal at that time. Chest x ray to be done at 2 PM .  RN to place chest tube back to suction if pneumothorax increased in size .

## 2021-03-03 NOTE — Progress Notes (Signed)
[facility]  Office: [phone]  Ardelle Anton, DO, Susy Frizzle, DO, Freddi Che, DO, Tinnie Gens Blood, DO, Salome Arnt, MD, Lowry Bowl, MD, Arnold Long, MD, Chad Cordial, MD, Minus Breeding, MD, Seabron Spates, MD, Yolanda Bonine, MD, Silvio Clayman, DO, Juleen Starr, DO, Ree Shay, MD,  Orion Modest, DO, Dinah Beers, MD, Jeanmarie Plant, MD, Tiney Rouge, MD, Burman Nieves, DO, Wilnette Kales, MD, Durwin Glaze, MD, Carlena Hurl, CNP, [facility] DelGrosso, CNP, Marylin Crosby, CNP, Reola Mosher, CNS, Erick Alley, CNP, Ival Bible, CNP, Dereck Ligas, CNP, Luberta Mutter, CNP, Henry Russel, CNP, Luna Glasgow, PA-C, Bess Kinds, DNP, Minna Antis, DNP, Farrel Gordon, CNP, Glennie Hawk, CNP, Ileene Patrick, CNP, Franklyn Lor, CNP, Hulen Shouts, CNP, Rikki Spearing, CNP       [facility]      Daily Progress Note     Admit Date: 02/22/2021  Bed/Room No.  2036/2036-01  Admitting Physician : Arnold Long, MD  Code Status :Full Code  Hospital Day:  LOS: 8 days   Chief Complaint:     Chief Complaint   Patient presents with   ??? Chest Pain   ??? Cough     Principal Problem:    Primary spontaneous pneumothorax  Active Problems:    Nicotine dependence, uncomplicated  Resolved Problems:    * No resolved hospital problems. *    Subjective :        Interval History/Significant events :  03/03/21    Patient has no new complaints.  He was sleeping comfortably.  Patient did not report any chest pain, breathing difficulty on awakening.  Chest tube is still on the suction.  Vitals - Stable afebrile  Labs/lab results -stable position of right-sided pigtail pleural drainage catheter    Nursing notes , Consults notes reviewed. Overnight events and updates discussed with Nursing staff .   Background History:         Jack Harrington is 20 y.o. male  Who was admitted to the hospital on 02/22/2021 for treatment of Primary spontaneous pneumothorax.   Patient was admitted after presenting with Chest  pain and cough.  Patient was at work and had sensation of sneeze.  He held his sneeze by holding his breath and developed sudden onset of severe stabbing chest pain 8/10 with associated difficulty breathing.  Patient came to ED and was found to have moderate pneumothorax on the right. Patient is smoker with e-cigrettes  Patient is 6 feet tall with weight of 131 pound and BMI of 17.7.  Right-sided pigtail pleural drainage catheter was placed.  CT surgery was consulted.    PMH:  History reviewed. No pertinent past medical history.   Allergies: No Known Allergies   Medications :  sodium chloride flush, 5-40 mL, IntraVENous, 2 times per day  enoxaparin, 40 mg, SubCUTAneous, Daily        Review of Systems   Review of Systems   Constitutional: Negative for activity change, appetite change, fatigue, fever and unexpected weight change.   HENT: Negative for congestion, nosebleeds, rhinorrhea, sinus pressure, sneezing and voice change.    Respiratory: Negative for cough, choking, chest tightness, shortness of breath and wheezing.    Cardiovascular: Negative for chest pain, palpitations and leg swelling.   Gastrointestinal: Negative for abdominal pain, constipation, diarrhea, nausea and vomiting.   Genitourinary: Negative for difficulty urinating, dysuria, frequency, penile discharge and testicular pain.   Musculoskeletal: Negative for back pain.   Skin: Negative for rash.   Neurological: Negative for dizziness, weakness,  light-headedness and headaches.   Hematological: Does not bruise/bleed easily.   Psychiatric/Behavioral: Negative for agitation, behavioral problems, confusion, self-injury, sleep disturbance and suicidal ideas.     Objective :      Current Vitals : Temp: 97.4 ??F (36.3 ??C),  Pulse: 65, Resp: 18, BP: 100/62, SpO2: 96 %  Last 24 Hrs Vitals   Patient Vitals for the past 24 hrs:   BP Temp Temp src Pulse Resp SpO2 Height   03/03/21 0600 -- -- -- 65 -- 96 % --   03/03/21 0500 -- -- -- 65 -- 95 % --   03/03/21 0400  100/62 97.4 ??F (36.3 ??C) Temporal 63 -- 98 % --   03/03/21 0300 -- -- -- 70 -- 97 % --   03/03/21 0235 -- -- -- -- -- 97 % --   03/03/21 0200 -- -- -- 65 -- 96 % --   03/03/21 0100 -- -- -- 69 -- 96 % --   03/03/21 0000 136/78 98 ??F (36.7 ??C) Temporal 78 -- 97 % --   03/02/21 2300 136/78 -- -- 73 -- 98 % --   03/02/21 2200 -- -- -- 76 -- -- --   03/02/21 2000 (!) 143/75 97.9 ??F (36.6 ??C) Temporal 99 -- 95 % --   03/02/21 1600 120/75 98 ??F (36.7 ??C) Temporal 81 18 97 % --   03/02/21 1200 118/71 97.9 ??F (36.6 ??C) Temporal 115 18 95 % --   03/02/21 1140 -- -- -- -- -- -- 6' (1.829 m)     Intake / output   03/21 1901 - 03/23 0700  In: 490 [P.O.:480; I.V.:10]  Out: 0   Physical Exam:  Physical Exam  Vitals and nursing note reviewed.   Constitutional:       General: He is not in acute distress.     Appearance: He is underweight. He is not diaphoretic.   HENT:      Head: Normocephalic and atraumatic.      Nose:      Right Sinus: No maxillary sinus tenderness or frontal sinus tenderness.      Left Sinus: No maxillary sinus tenderness or frontal sinus tenderness.      Mouth/Throat:      Pharynx: No oropharyngeal exudate.   Eyes:      General: No scleral icterus.     Conjunctiva/sclera: Conjunctivae normal.      Pupils: Pupils are equal, round, and reactive to light.   Neck:      Thyroid: No thyromegaly.      Vascular: No JVD.   Cardiovascular:      Rate and Rhythm: Normal rate and regular rhythm.      Pulses:           Dorsalis pedis pulses are 2+ on the right side and 2+ on the left side.      Heart sounds: Normal heart sounds. No murmur heard.      Pulmonary:      Effort: Pulmonary effort is normal.      Breath sounds: Normal breath sounds. No wheezing or rales.   Chest:      Comments: Right-sided chest tube in place.  Abdominal:      Palpations: Abdomen is soft. There is no mass.      Tenderness: There is no abdominal tenderness.   Musculoskeletal:      Cervical back: Full passive range of motion without pain and neck  supple.   Lymphadenopathy:  Head:      Right side of head: No submandibular adenopathy.      Left side of head: No submandibular adenopathy.      Cervical: No cervical adenopathy.   Skin:     General: Skin is warm.   Neurological:      Mental Status: He is alert and oriented to person, place, and time.      Motor: No tremor.   Psychiatric:         Behavior: Behavior is cooperative.           Laboratory findings:    No results for input(s): WBC, HGB, HCT, PLT, SEDRATE, INR in the last 72 hours.    Invalid input(s): PT  No results for input(s): NA, K, CL, CO2, GLUCOSE, BUN, CREATININE, MG, CALCIUM, CAION, PHOS, PSA in the last 72 hours.  No results for input(s): PROT, LABALBU, LABA1C, T3TOTAL, T4TOTAL, FT4, TSH, AST, ALT, LDH, GGT, ALKPHOS, BILITOT, BILIDIR, AMMONIA, AMYLASE, LIPASE, LACTATE, CHOL, HDL, LDLCHOLESTEROL, CHOLHDLRATIO, TRIG, VLDL, BNP, TROPONINI, CKTOTAL, CKMB, CKMBINDEX, RF, ANA in the last 72 hours.       No results found for: SPECGRAV, PROTEINU, RBCUA, BLOODU, BACTERIA, NITRU, WBCUA, LEUKOCYTESUR    Imaging / Clinical Data :-   CT CHEST WO CONTRAST    Result Date: 02/24/2021  1. There is a small bore right-sided chest tube noted anteriorly in the chest with a small right-sided pneumothorax.     XR CHEST PORTABLE    Result Date: 03/02/2021  Stable positioning superior right sided pigtail pleural drainage catheter with suspected subtle trace right apical pneumothorax.     XR CHEST PORTABLE    Result Date: 03/01/2021  Right apical chest tube unchanged.  No appreciable pneumothorax.  No infiltrates.     XR CHEST PORTABLE    Result Date: 02/28/2021  1. Stable trace right apical pneumothorax. 2. Stable positioning of right apical pigtail catheter. 3. No acute focal airspace consolidation.     XR CHEST PORTABLE    Result Date: 02/27/2021  1. Stable trace right apical pneumothorax.  Right apical pigtail catheter chest tube stable in position. 2. No acute focal airspace consolidation.     XR CHEST  PORTABLE    Result Date: 02/26/2021  Status post upsize and repositioning of the right pleural drain with nearly resolved trace right pneumothorax with 3 mm pleuroparenchymal gap remaining.     XR CHEST PORTABLE    Result Date: 02/26/2021  1. Larger right pneumothorax now measuring 4.7 cm from the pleural surface.     XR CHEST PORTABLE    Result Date: 02/25/2021  Decrease in size of the right pneumothorax, which is now small.     XR CHEST PORTABLE    Result Date: 02/25/2021  Re-accumulation of the moderate right pneumothorax.     XR CHEST PORTABLE    Result Date: 02/24/2021  Decrease in the size of the right pneumothorax.     XR CHEST PORTABLE    Result Date: 02/24/2021  Recurrence of the right mild to moderate pneumothorax.     IR CHEST TUBE INSERTION    Result Date: 02/26/2021  Successful upsize and placement of a 12 French right pleural drain.        Clinical Course : stable  Assessment and Plan  :        1. Primary spontaneous pneumothorax - s/p right-sided pigtail pleural drainage catheter placement.  Catheter suction.  Management per CT surgery.  2. Nicotine dependence-recommend complete abstinence.  Awaiting further recommendations from CT surgery team for chest tube management.  Continue to monitor vitals , Intake / output ,  Cell count , HGB , Kidney function, oxygenation  as indicated .   Plan and updates discussed with patient ,  answers  explained to satisfaction.   Plan discussed with Staff Jeanice LimHolly  RN     (Please note that portions of this note were completed with a voice recognition program. Efforts were made to edit the dictations but occasionally words are mis-transcribed.)      Arnold LongShowkat Kristene Liberati, MD  03/03/2021

## 2021-03-03 NOTE — Plan of Care (Signed)
Patient scheduled for a VATS tomorrow with Dr. Suzanna Obey.

## 2021-03-04 ENCOUNTER — Inpatient Hospital Stay: Admit: 2021-03-04 | Payer: BLUE CROSS/BLUE SHIELD

## 2021-03-04 LAB — BASIC METABOLIC PANEL
Anion Gap: 10 mmol/L (ref 9–17)
BUN: 13 mg/dL (ref 6–20)
Bun/Cre Ratio: 15 (ref 9–20)
CO2: 26 mmol/L (ref 20–31)
Calcium: 10.2 mg/dL (ref 8.6–10.4)
Chloride: 102 mmol/L (ref 98–107)
Creatinine: 0.89 mg/dL (ref 0.70–1.20)
GFR African American: >60 mL/min (ref >60)
GFR Non-African American: >60 mL/min (ref >60)
Glucose: 99 mg/dL (ref 70–99)
Potassium: 4.3 mmol/L (ref 3.7–5.3)
Sodium: 138 mmol/L (ref 135–144)

## 2021-03-04 LAB — CBC
Hematocrit: 45.2 % (ref 40.7–50.3)
Hemoglobin: 15.2 g/dL (ref 13.0–17.0)
MCH: 29.2 pg (ref 25.2–33.5)
MCHC: 33.6 g/dL (ref 28.4–34.8)
MCV: 86.8 fL (ref 82.6–102.9)
MPV: 10.7 fL (ref 8.1–13.5)
NRBC Automated: 0 per 100 WBC
Platelets: 293 10*3/uL (ref 138–453)
RBC: 5.21 m/uL (ref 4.21–5.77)
RDW: 11.8 % (ref 11.8–14.4)
WBC: 7.4 10*3/uL (ref 4.5–13.5)

## 2021-03-04 MED ORDER — CEFAZOLIN 2000 MG D5W 50 ML IVPB
Status: DC
Start: 2021-03-04 — End: 2021-03-04

## 2021-03-04 MED ORDER — CHLORHEXIDINE GLUCONATE 4 % EX LIQD
4 % | CUTANEOUS | Status: DC
Start: 2021-03-04 — End: 2021-03-04

## 2021-03-04 MED ORDER — MUPIROCIN 2 % EX OINT
2 % | Freq: Two times a day (BID) | CUTANEOUS | Status: DC
Start: 2021-03-04 — End: 2021-03-04

## 2021-03-04 MED ORDER — CHLORHEXIDINE GLUCONATE 0.12 % MT SOLN
0.12 % | Freq: Once | OROMUCOSAL | Status: AC
Start: 2021-03-04 — End: 2021-03-04
  Administered 2021-03-04: 11:00:00 via OROMUCOSAL

## 2021-03-04 MED FILL — MUPIROCIN 2 % EX OINT: 2 % | CUTANEOUS | Qty: 22

## 2021-03-04 MED FILL — CEFAZOLIN 2000 MG D5W 50 ML IVPB: Qty: 50

## 2021-03-04 MED FILL — CHLORHEXIDINE GLUCONATE 0.12 % MT SOLN: 0.12 % | OROMUCOSAL | Qty: 473

## 2021-03-04 NOTE — Progress Notes (Signed)
Patient confirms that he has all of his personal belongings that he brought to the hospital in his possession, including his cell phone and charger. Patient ambulated to front lobby to be transported home by his fiance's father.  Patient alert, oriented, denies any shortness of breath and in no apparent distress upon departure of unit.

## 2021-03-04 NOTE — Discharge Instructions (Signed)
Patient to go to Emergency Department or call 911 if shortness of breath occurs.         Collapsed Lung: Care Instructions  Your Care Instructions     A collapsed lung (pneumothorax) is a buildup of air in the space between the lung and the chest wall. As more air builds up in this space, the pressure against the lung makes the lung collapse. This causes shortness of breath and chest pain because your lung cannot fully expand.  A collapsed lung is usually caused by an injury to the chest, but it may also occur suddenly without an injury because of a lung illness, such as emphysema or lung fibrosis. Your lung may collapse after lung surgery or another medical procedure. Sometimes it happens for no known reason in an otherwise healthy person (spontaneous pneumothorax).  Treatment depends on the cause of the collapse. It may heal with rest, although your doctor will want to keep track of your progress. It can take several days for the lung to expand again. Your doctor may have drained the air with a needle or tube inserted into the space between your chest and the collapsed lung. If you have a chest tube, be sure to follow your doctor's instructions about how to care for the tube.  You may need further treatment if you are not getting better. Surgery is sometimes needed to keep the lung inflated. The doctor will want to keep track of your progress, so you will need a follow-up exam within a few days.  The doctor has checked you carefully, but problems can develop later. If you notice any problems or new symptoms, get medical treatment right away.  Follow-up care is a key part of your treatment and safety. Be sure to make and go to all appointments, and call your doctor if you are having problems. It's also a good idea to know your test results and keep a list of the medicines you take.  How can you care for yourself at home?   Get plenty of rest and sleep. You may feel weak and tired for a while, but your energy level  will improve with time.   Hold a pillow against your chest when you cough or take deep breaths. This will support your chest and decrease your pain.   Take pain medicines exactly as directed.  ? If the doctor gave you a prescription medicine for pain, take it as prescribed.  ? If you are not taking a prescription pain medicine, ask your doctor if you can take an over-the-counter medicine.   If your doctor prescribed antibiotics, take them as directed. Do not stop taking them just because you feel better. You need to take the full course of antibiotics.   If you have a bandage over your chest tube, or the place where the chest tube was inserted, keep it clean and dry. Follow your doctor's instructions on bandage care.   If you go home with a tube in place, follow the doctor's directions. Do not adjust the tube in any way. This could break the seal or cause other problems. Keep the tube dry.   Avoid any movements that require your muscles, especially your chest muscles, to strain. Such movements include laughing hard, bearing down to have a bowel movement, and heavy lifting. Try not to cough.   Do not fly in an airplane until your doctor tells you it is okay. Avoid any situations where there is increased air pressure.  Do not smoke or allow others to smoke around you. If you need help quitting, talk to your doctor about stop-smoking programs and medicines. These can increase your chances of quitting for good.  When should you call for help?   Call 911 anytime you think you may need emergency care. For example, call if:    You have severe trouble breathing.     You passed out (lost consciousness).   Call your doctor now or seek immediate medical care if:    You have new or worse trouble breathing.     You have new pain or your pain gets worse.     You have a fever.     You cough up blood.     Your chest tube starts to come out or falls out.     You are bleeding through the bandage where the  tube was put in.   Watch closely for changes in your health, and be sure to contact your doctor if:    The skin around the place where the chest tube was put in is red or irritated.     You do not get better as expected.   Where can you learn more?  Go to [email or web address] and sign in to your MyChart account. Enter [phone] in the Search Health Information box to learn more about "Collapsed Lung: Care Instructions."     If you do not have an account, please click on the "Sign Up Now" link.  Current as of: July 6, 2021Content Version: 13.1   2006-2021 [employer].   Care instructions adapted under license by [facility]. If you have questions about a medical condition or this instruction, always ask your healthcare professional. [employer] disclaims any warranty or liability for your use of this information.

## 2021-03-04 NOTE — Progress Notes (Signed)
Discharge Summary reviewed with patient and he verbalized understanding.  Discharge Summary instructions and outpatient chest x-ray order given to patient.  Patient awaiting transportation home.

## 2021-03-04 NOTE — Care Coordination-Inpatient (Signed)
Discharge planning:    Patient will be discharged home today independently. Follow up appointment made.

## 2021-03-04 NOTE — Care Coordination-Inpatient (Signed)
Pigtail chest tube removed at this time. No distress noted at this time. Will repeat CXR in 4 hours. Keep NPO

## 2021-03-04 NOTE — Plan of Care (Signed)
CXR shows no evidence of pneumothorax. VATS procedure will be cancelled at this time. Patient ok to be discharged today per CT surgery. The patient will follow up outpatient and obtain a CXR Friday or Monday. Patient to go to the ED if shortness of breath occurs.

## 2021-03-04 NOTE — Progress Notes (Signed)
[facility]  Office: [phone]  Ardelle Anton, DO, Susy Frizzle, DO, Freddi Che, DO, Tinnie Gens Blood, DO, Salome Arnt, MD, Lowry Bowl, MD, Arnold Long, MD, Chad Cordial, MD, Minus Breeding, MD, Seabron Spates, MD, Yolanda Bonine, MD, Silvio Clayman, DO, Juleen Starr, DO, Ree Shay, MD,  Orion Modest, DO, Dinah Beers, MD, Jeanmarie Plant, MD, Tiney Rouge, MD, Burman Nieves, DO, Wilnette Kales, MD, Durwin Glaze, MD, Carlena Hurl, CNP, [facility] DelGrosso, CNP, Marylin Crosby, CNP, Reola Mosher, CNS, Erick Alley, CNP, Ival Bible, CNP, Dereck Ligas, CNP, Luberta Mutter, CNP, Henry Russel, CNP, Luna Glasgow, PA-C, Bess Kinds, DNP, Minna Antis, DNP, Farrel Gordon, CNP, Glennie Hawk, CNP, Ileene Patrick, CNP, Franklyn Lor, CNP, Hulen Shouts, CNP, Rikki Spearing, CNP       [facility]      Daily Progress Note     Admit Date: 02/22/2021  Bed/Room No.  2036/2036-01  Admitting Physician : Arnold Long, MD  Code Status :Full Code  Hospital Day:  LOS: 9 days   Chief Complaint:     Chief Complaint   Patient presents with   ??? Chest Pain   ??? Cough     Principal Problem:    Primary spontaneous pneumothorax  Active Problems:    Nicotine dependence, uncomplicated  Resolved Problems:    * No resolved hospital problems. *    Subjective :        Interval History/Significant events :  03/04/21    Patient's chest tube has been removed.  Patient denies any dyspnea, chest pain, palpitations.  He has tachycardia heart rate 108.  Patient denies any symptoms.  Vitals - Stable afebrile  Labs/lab results -stable position of right-sided pigtail pleural drainage catheter    Nursing notes , Consults notes reviewed. Overnight events and updates discussed with Nursing staff .   Background History:         Jack Harrington is 20 y.o. male  Who was admitted to the hospital on 02/22/2021 for treatment of Primary spontaneous pneumothorax.   Patient was admitted after presenting with Chest pain and cough.   Patient was at work and had sensation of sneeze.  He held his sneeze by holding his breath and developed sudden onset of severe stabbing chest pain 8/10 with associated difficulty breathing.  Patient came to ED and was found to have moderate pneumothorax on the right. Patient is smoker with e-cigrettes  Patient is 6 feet tall with weight of 131 pound and BMI of 17.7.  Right-sided pigtail pleural drainage catheter was placed.  CT surgery was consulted.    PMH:  History reviewed. No pertinent past medical history.   Allergies: No Known Allergies   Medications :  ceFAZolin (ANCEF) IVPB, 2,000 mg, IntraVENous, On Call to OR  mupirocin, , Nasal, BID  chlorhexidine, , Topical, See Admin Instructions  sodium chloride flush, 5-40 mL, IntraVENous, 2 times per day  sodium chloride flush, 5-40 mL, IntraVENous, 2 times per day  [Held by provider] enoxaparin, 40 mg, SubCUTAneous, Daily        Review of Systems   Review of Systems   Constitutional: Negative for activity change, appetite change, fatigue, fever and unexpected weight change.   HENT: Negative for congestion, nosebleeds, rhinorrhea, sinus pressure, sneezing and voice change.    Respiratory: Negative for cough, choking, chest tightness, shortness of breath and wheezing.    Cardiovascular: Negative for chest pain, palpitations and leg swelling.   Gastrointestinal: Negative for abdominal pain, constipation, diarrhea, nausea and vomiting.  Genitourinary: Negative for difficulty urinating, dysuria, frequency, penile discharge and testicular pain.   Musculoskeletal: Negative for back pain.   Skin: Negative for rash.   Neurological: Negative for dizziness, weakness, light-headedness and headaches.   Hematological: Does not bruise/bleed easily.   Psychiatric/Behavioral: Negative for agitation, behavioral problems, confusion, self-injury, sleep disturbance and suicidal ideas.     Objective :      Current Vitals : Temp: 97.3 ??F (36.3 ??C),  Pulse: 86, Resp: 18, BP: 129/63,  SpO2: 96 %  Last 24 Hrs Vitals   Patient Vitals for the past 24 hrs:   BP Temp Temp src Pulse Resp SpO2 Height Weight   03/04/21 0756 -- 97.3 ??F (36.3 ??C) Temporal -- -- -- -- --   03/04/21 0400 129/63 97.3 ??F (36.3 ??C) Temporal 86 18 96 % -- --   03/04/21 0300 -- -- -- 53 -- -- -- --   03/04/21 0200 -- -- -- 62 -- -- -- --   03/04/21 0100 -- -- -- 70 -- -- -- --   03/04/21 0036 118/67 98.6 ??F (37 ??C) Temporal 82 18 -- 6' (1.829 m) 131 lb (59.4 kg)   03/04/21 0000 -- -- -- 67 -- 97 % -- --   03/03/21 2300 -- -- -- 69 -- 96 % -- --   03/03/21 2200 -- -- -- 74 -- 96 % -- --   03/03/21 2100 -- -- -- 78 -- 99 % -- --   03/03/21 2000 134/86 97.6 ??F (36.4 ??C) Temporal 74 18 98 % -- --   03/03/21 1900 -- -- -- 73 -- -- -- --   03/03/21 1600 -- 97 ??F (36.1 ??C) Temporal -- 18 -- -- --   03/03/21 1200 121/67 97.7 ??F (36.5 ??C) Temporal 70 18 -- -- --   03/03/21 0832 130/75 -- -- 80 -- -- -- --     Intake / output   03/22 1901 - 03/24 0700  In: 200 [P.O.:200]  Out: 3 [Urine:1]  Physical Exam:  Physical Exam  Vitals and nursing note reviewed.   Constitutional:       General: He is not in acute distress.     Appearance: He is underweight. He is not diaphoretic.   HENT:      Head: Normocephalic and atraumatic.      Nose:      Right Sinus: No maxillary sinus tenderness or frontal sinus tenderness.      Left Sinus: No maxillary sinus tenderness or frontal sinus tenderness.      Mouth/Throat:      Pharynx: No oropharyngeal exudate.   Eyes:      General: No scleral icterus.     Conjunctiva/sclera: Conjunctivae normal.      Pupils: Pupils are equal, round, and reactive to light.   Neck:      Thyroid: No thyromegaly.      Vascular: No JVD.   Cardiovascular:      Rate and Rhythm: Normal rate and regular rhythm.      Pulses:           Dorsalis pedis pulses are 2+ on the right side and 2+ on the left side.      Heart sounds: Normal heart sounds. No murmur heard.      Pulmonary:      Effort: Pulmonary effort is normal.      Breath sounds:  Normal breath sounds. No wheezing or rales.   Abdominal:      Palpations: Abdomen is  soft. There is no mass.      Tenderness: There is no abdominal tenderness.   Musculoskeletal:      Cervical back: Full passive range of motion without pain and neck supple.   Lymphadenopathy:      Head:      Right side of head: No submandibular adenopathy.      Left side of head: No submandibular adenopathy.      Cervical: No cervical adenopathy.   Skin:     General: Skin is warm.   Neurological:      Mental Status: He is alert and oriented to person, place, and time.      Motor: No tremor.   Psychiatric:         Behavior: Behavior is cooperative.           Laboratory findings:    Recent Labs     03/04/21  0456   WBC 7.4   HGB 15.2   HCT 45.2   PLT 293     Recent Labs     03/04/21  0456   NA 138   K 4.3   CL 102   CO2 26   GLUCOSE 99   BUN 13   CREATININE 0.89   CALCIUM 10.2     No results for input(s): PROT, LABALBU, LABA1C, T3TOTAL, T4TOTAL, FT4, TSH, AST, ALT, LDH, GGT, ALKPHOS, BILITOT, BILIDIR, AMMONIA, AMYLASE, LIPASE, LACTATE, CHOL, HDL, LDLCHOLESTEROL, CHOLHDLRATIO, TRIG, VLDL, BNP, TROPONINI, CKTOTAL, CKMB, CKMBINDEX, RF, ANA in the last 72 hours.       No results found for: SPECGRAV, PROTEINU, RBCUA, BLOODU, BACTERIA, NITRU, WBCUA, LEUKOCYTESUR    Imaging / Clinical Data :-   CT CHEST WO CONTRAST    Result Date: 02/24/2021  1. There is a small bore right-sided chest tube noted anteriorly in the chest with a small right-sided pneumothorax.     XR CHEST PORTABLE    Result Date: 03/02/2021  Stable positioning superior right sided pigtail pleural drainage catheter with suspected subtle trace right apical pneumothorax.     XR CHEST PORTABLE    Result Date: 03/01/2021  Right apical chest tube unchanged.  No appreciable pneumothorax.  No infiltrates.     XR CHEST PORTABLE    Result Date: 02/28/2021  1. Stable trace right apical pneumothorax. 2. Stable positioning of right apical pigtail catheter. 3. No acute focal airspace  consolidation.     XR CHEST PORTABLE    Result Date: 02/27/2021  1. Stable trace right apical pneumothorax.  Right apical pigtail catheter chest tube stable in position. 2. No acute focal airspace consolidation.     XR CHEST PORTABLE    Result Date: 02/26/2021  Status post upsize and repositioning of the right pleural drain with nearly resolved trace right pneumothorax with 3 mm pleuroparenchymal gap remaining.     XR CHEST PORTABLE    Result Date: 02/26/2021  1. Larger right pneumothorax now measuring 4.7 cm from the pleural surface.     XR CHEST PORTABLE    Result Date: 02/25/2021  Decrease in size of the right pneumothorax, which is now small.     XR CHEST PORTABLE    Result Date: 02/25/2021  Re-accumulation of the moderate right pneumothorax.     XR CHEST PORTABLE    Result Date: 02/24/2021  Decrease in the size of the right pneumothorax.     XR CHEST PORTABLE    Result Date: 02/24/2021  Recurrence of the right mild to moderate pneumothorax.  IR CHEST TUBE INSERTION    Result Date: 02/26/2021  Successful upsize and placement of a 12 French right pleural drain.        Clinical Course : stable  Assessment and Plan  :        1. Primary spontaneous pneumothorax - s/p right-sided pigtail pleural drainage catheter placement.  Catheter to water seal.  Management per CT surgery.  2. Nicotine dependence-recommend complete abstinence.    Repeat CXR later today, patient NPO for possible surgery if pneumothorax recurs.   Continue to monitor vitals , Intake / output ,  Cell count , HGB , Kidney function, oxygenation  as indicated .   Plan and updates discussed with patient ,  answers  explained to satisfaction.   Plan discussed with Staff  RN     (Please note that portions of this note were completed with a voice recognition program. Efforts were made to edit the dictations but occasionally words are mis-transcribed.)      Arnold Long, MD  03/04/2021

## 2021-03-04 NOTE — Discharge Summary (Signed)
[facility]  Office: [phone]  Ardelle Anton DO, Susy Frizzle DO, Freddi Che DO, Tinnie Gens Blood DO, Virender Randolm Idol MD, Lowry Bowl MD, Arnold Long MD, Chad Cordial MD, Yolanda Manges MD, Minus Breeding MD, Seabron Spates MD, Yolanda Bonine MD, Mbonu Stark Jock MD, Silvio Clayman DO, Mahmud Donnetta Hutching MD, Frances Maywood MD, Juleen Starr DO, Ree Shay MD,  Orion Modest DO, Dinah Beers MD, Vickki Hearing MD, Carlena Hurl CNP, [facility] DelGrosso CNP, Marylin Crosby CNP, Reola Mosher CNS, [facility] CNP, Ival Bible CNP, Berneta Sages CNP, Amber Swearingen CNP, Henry Russel CNP, Luna Glasgow PA-C, Bess Kinds CNP, Franklyn Lor CNP, [facility] CNP, Farrel Gordon CNP, Theodosia Blender CNP, Glennie Hawk, CNP             [facility]      Discharge Summary     Patient ID: Jack Harrington  DOB:  [birth year]   MRN: [id number]     ACCOUNT:  [id number]   Patient Location : [id number]  Patient's PCP: No primary care provider on file.  Admit Date: 02/22/2021   Discharge Date: 03/04/2021     Length of Stay: 9  Code Status:  Full Code  Admitting Physician: Arnold Long, MD  Discharge Physician: Arnold Long, MD     Active Discharge Diagnosis :     Primary Problem  Primary spontaneous pneumothorax      Hospital Problems  Active Hospital Problems    Diagnosis Date Noted   ??? Primary spontaneous pneumothorax [J93.11] 02/23/2021   ??? Nicotine dependence, uncomplicated [F17.200] 02/23/2021       Admission Condition:  fair     Discharged Condition: stable    Hospital Stay:     Hospital Course:  Jack Harrington is a 20 y.o. male who was admitted for the management of   Primary spontaneous pneumothorax , presented to ER with Chest Pain and Cough  Patient was admitted after presenting with Chest pain and cough.  Patient was at work and had sensation of sneeze.  He held his sneeze by holding his breath and developed sudden onset of severe stabbing chest pain 8/10 with associated difficulty breathing.   Patient came to ED and was found to have moderate pneumothorax on the right. Patient is smoker with e-cigrettes  Patient is 6 feet tall with weight of 131 pound and BMI of 17.7.  Right-sided pigtail pleural drainage catheter was placed.  CT surgery was consulted.        Significant therapeutic interventions:   1. Primary spontaneous pneumothorax - s/p right-sided pigtail pleural drainage catheter placement.  Catheter to water seal.  Management per CT surgery.  2. Nicotine dependence-recommend complete abstinence.      Significant Diagnostic Studies:   Labs / Micro:/Radiology  Recent Labs     03/04/21  0456 02/25/21  1235 02/22/21  2330   WBC 7.4 4.8 10.1   HGB 15.2 15.8 15.4   HCT 45.2 46.2 45.0   MCV 86.8 86.5 86.7   PLT 293 217 205     Labs Renal Latest Ref Rng & Units 03/04/2021 02/25/2021 02/24/2021 02/22/2021   BUN 6 - 20 mg/dL 13 14 10 13    Cr 0.70 - 1.20 mg/dL 0.09 3.81 8.29   K 3.7 - 5.3 mmol/L 4.3 4.2 4.7 4.1   Na 135 - 144 mmol/L 138 140 139 137     No results found for: ALT, AST, GGT, ALKPHOS, BILITOT  No results found for: TSHFT4, TSH  No results found  for: HAV, HEPAIGM, HEPBIGM, HEPBCAB, HBEAG, HEPCAB  No results found for: VOL, APPEARANCE, COLORU, LABSPEC, LABPH, LEUKBLD, NITRU, GLUCOSEU, KETUA, UROBILINOGEN, KETUA, UROBILINOGEN, BILIRUBINUR, OCBU  No results found for: LABA1C  No results found for: EAG  Lab Results   Component Value Date    INR 1.0 02/25/2021    PROTIME 12.9 02/25/2021       XR CHEST PORTABLE    Result Date: 03/04/2021  No evidence of pneumothorax.     XR CHEST PORTABLE    Result Date: 03/04/2021  Stable right upper chest tube without radiologic evidence of pneumothorax.     XR CHEST PORTABLE    Result Date: 03/03/2021  No recurrent pneumothorax after reported right pleural drain to water seal     XR CHEST PORTABLE    Result Date: 03/03/2021  1. Stable right right upper lung zone pigtail catheter chest tube with stable trace right apical pneumothorax. 2. No acute focal airspace  consolidation or pleural effusions.     XR CHEST PORTABLE    Result Date: 03/02/2021  Stable positioning superior right sided pigtail pleural drainage catheter with suspected subtle trace right apical pneumothorax.     XR CHEST PORTABLE    Result Date: 03/01/2021  Right apical chest tube unchanged.  No appreciable pneumothorax.  No infiltrates.     XR CHEST PORTABLE    Result Date: 02/28/2021  1. Stable trace right apical pneumothorax. 2. Stable positioning of right apical pigtail catheter. 3. No acute focal airspace consolidation.     XR CHEST PORTABLE    Result Date: 02/27/2021  1. Stable trace right apical pneumothorax.  Right apical pigtail catheter chest tube stable in position. 2. No acute focal airspace consolidation.     XR CHEST PORTABLE    Result Date: 02/26/2021  Status post upsize and repositioning of the right pleural drain with nearly resolved trace right pneumothorax with 3 mm pleuroparenchymal gap remaining.     XR CHEST PORTABLE    Result Date: 02/26/2021  1. Larger right pneumothorax now measuring 4.7 cm from the pleural surface.     IR CHEST TUBE INSERTION    Result Date: 02/26/2021  Successful upsize and placement of a 12 French right pleural drain.             Consultations:    Consults:     Final Specialist Recommendations/Findings:   IP CONSULT TO INTERNAL MEDICINE  IP CONSULT TO CARDIOTHORACIC SURGERY      The patient was seen and examined on day of discharge and this discharge summary is in conjunction with any daily progress note from day of discharge.    Discharge plan:     Disposition: Home    Physician Follow Up:     Rosezetta Schlatter, MD  [address]  [address]  [place] [address]  [phone]    Go on 03/11/2021  For hospital follow up at 11:00 AM       Requiring Further Evaluation/Follow Up POST HOSPITALIZATION/Incidental Findings: f/u with CT Surgery     Diet: regular diet    Activity: As tolerated.    Instructions to Patient: stop smoking     Discharge Medications:      Medication  List      You have not been prescribed any medications.         Time Spent on discharge is  25 mins in patient examination, evaluation, counseling as well as medication reconciliation, prescriptions for required medications, discharge plan  and follow up.    Electronically signed by   Arnold Long, MD  03/04/2021        Thank you Dr. Bonnetta Barry primary care provider on file. for the opportunity to be involved in this patient's care.

## 2021-03-08 ENCOUNTER — Inpatient Hospital Stay: Admit: 2021-03-08 | Payer: PRIVATE HEALTH INSURANCE

## 2021-03-08 ENCOUNTER — Inpatient Hospital Stay: Payer: PRIVATE HEALTH INSURANCE

## 2021-03-08 DIAGNOSIS — J9311 Primary spontaneous pneumothorax: Secondary | ICD-10-CM

## 2021-03-11 ENCOUNTER — Encounter

## 2021-03-11 ENCOUNTER — Ambulatory Visit
Admit: 2021-03-11 | Discharge: 2021-03-11 | Payer: PRIVATE HEALTH INSURANCE | Attending: Thoracic Surgery (Cardiothoracic Vascular Surgery)

## 2021-03-11 DIAGNOSIS — Z5989 Other problems related to housing and economic circumstances: Secondary | ICD-10-CM

## 2021-03-11 NOTE — Progress Notes (Signed)
[facility]  Office Visit      Subjective:  Jack Harrington is a 20 y.o. male s/p recent chest tube after patient having a spontaneous pneumothorax.  Patient had chest x-ray done on Monday which is normal.  Patient denies any chest pain or shortness of breath.  Patient is back to many of his activities without any issues.  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  Patient needs to stay away from smoking marijuana,'s vaping, dabbing, smoking tobacco.  Patient states he is quit all these recreational activities.        Physical Exam  Vitals:  Vitals:    03/11/21 1103   BP: 109/68   Pulse: 83   Resp: 20   Temp: 98.2 ??F (36.8 ??C)   SpO2: 96%       General: Alert and Oriented x3. Ambulatory. No apparent distress.  Chest:  No abnormality.  Equal and symmetric expansion with respiration.  Lungs:  Clear to auscultation.  Cardiac:  Regular rate and rhythm without murmurs, rubs or gallops.   Abdomen:  Soft, non-tender, normoactive bowel sounds.   Extremities:  No edema.  Intact pulses in all four extremities.  Psychiatric: Mood and affect are appropriate.    Review chest x-ray which does not show appreciated pneumothorax on Monday.    Current Medications:  No current outpatient medications on file.    Past Surgical History:   Procedure Laterality Date   ??? IR CHEST TUBE INSERTION  02/26/2021    IR CHEST TUBE INSERTION 02/26/2021 Jack Richard, MD [facility]       Social Hx: reports that he has been smoking e-cigarettes. He has never used smokeless tobacco.    Assessment & Plan:   CXR in case he has SOB again. CXR from Monday stable with no SOB symptoms.     Social service referral for obtaining insurance.           Jack Harrington A Aylssa Herrig, APRN - NP,APRN CNP

## 2022-03-12 IMAGING — DX DG ANKLE COMPLETE 3+V*R*
3 series · 3 of 3 positions shown · non-contrast
Comparison: None.

CLINICAL DATA: 19-year-old male with swelling status post soccer
injury.

EXAM:
RIGHT ANKLE - COMPLETE 3+ VIEW

[ankle ap]
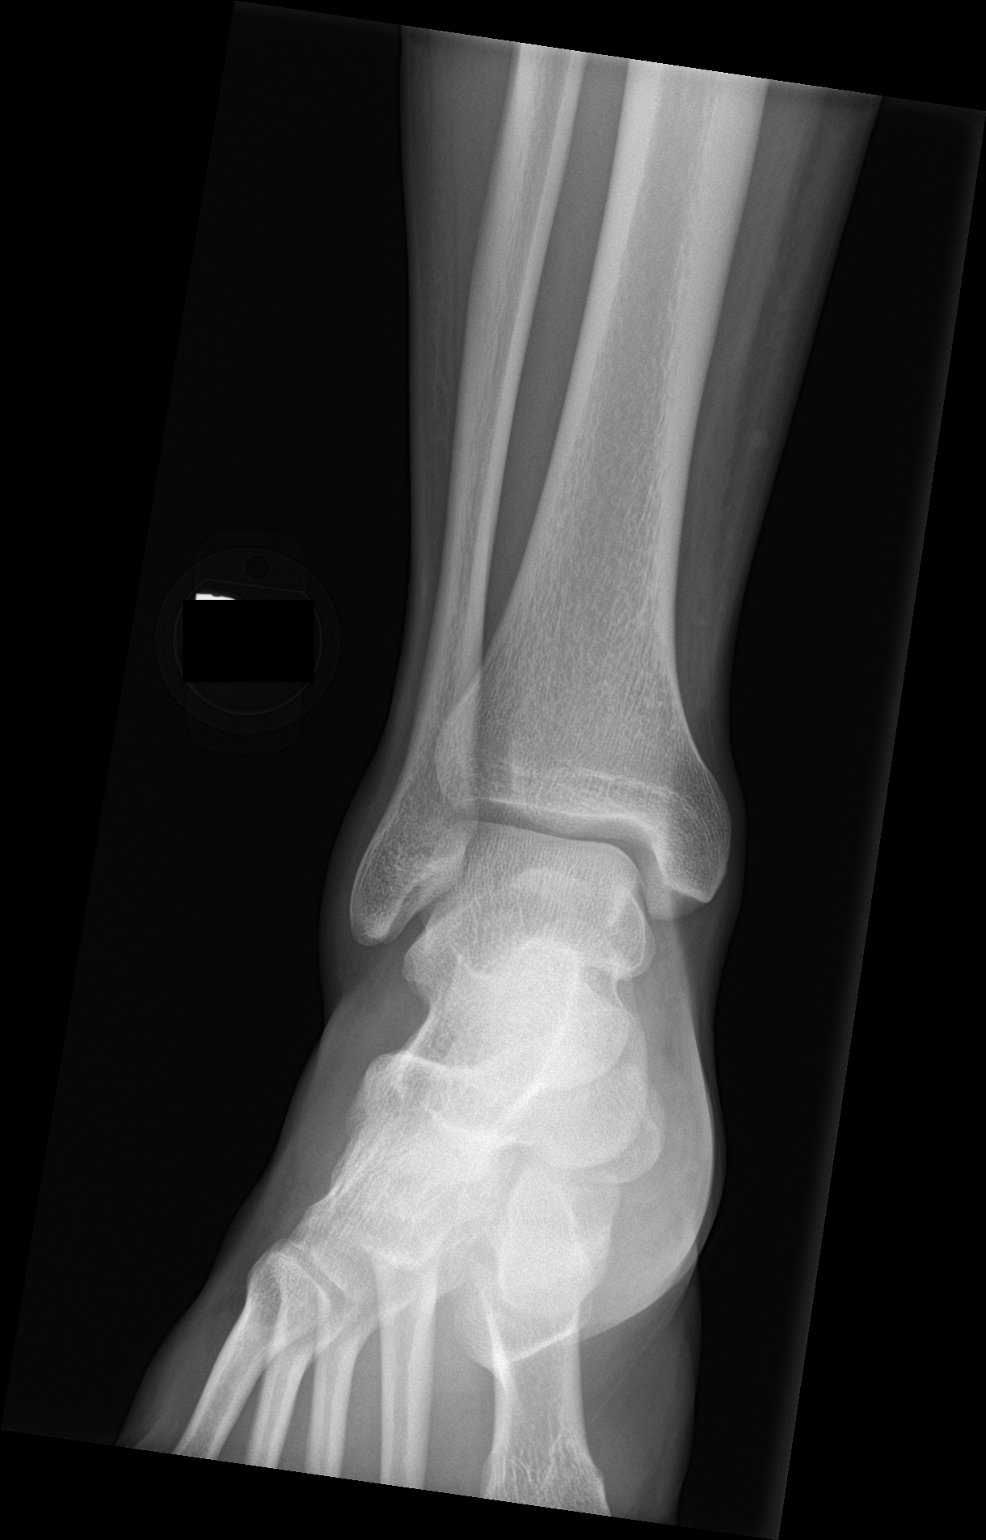

[ankle obl]
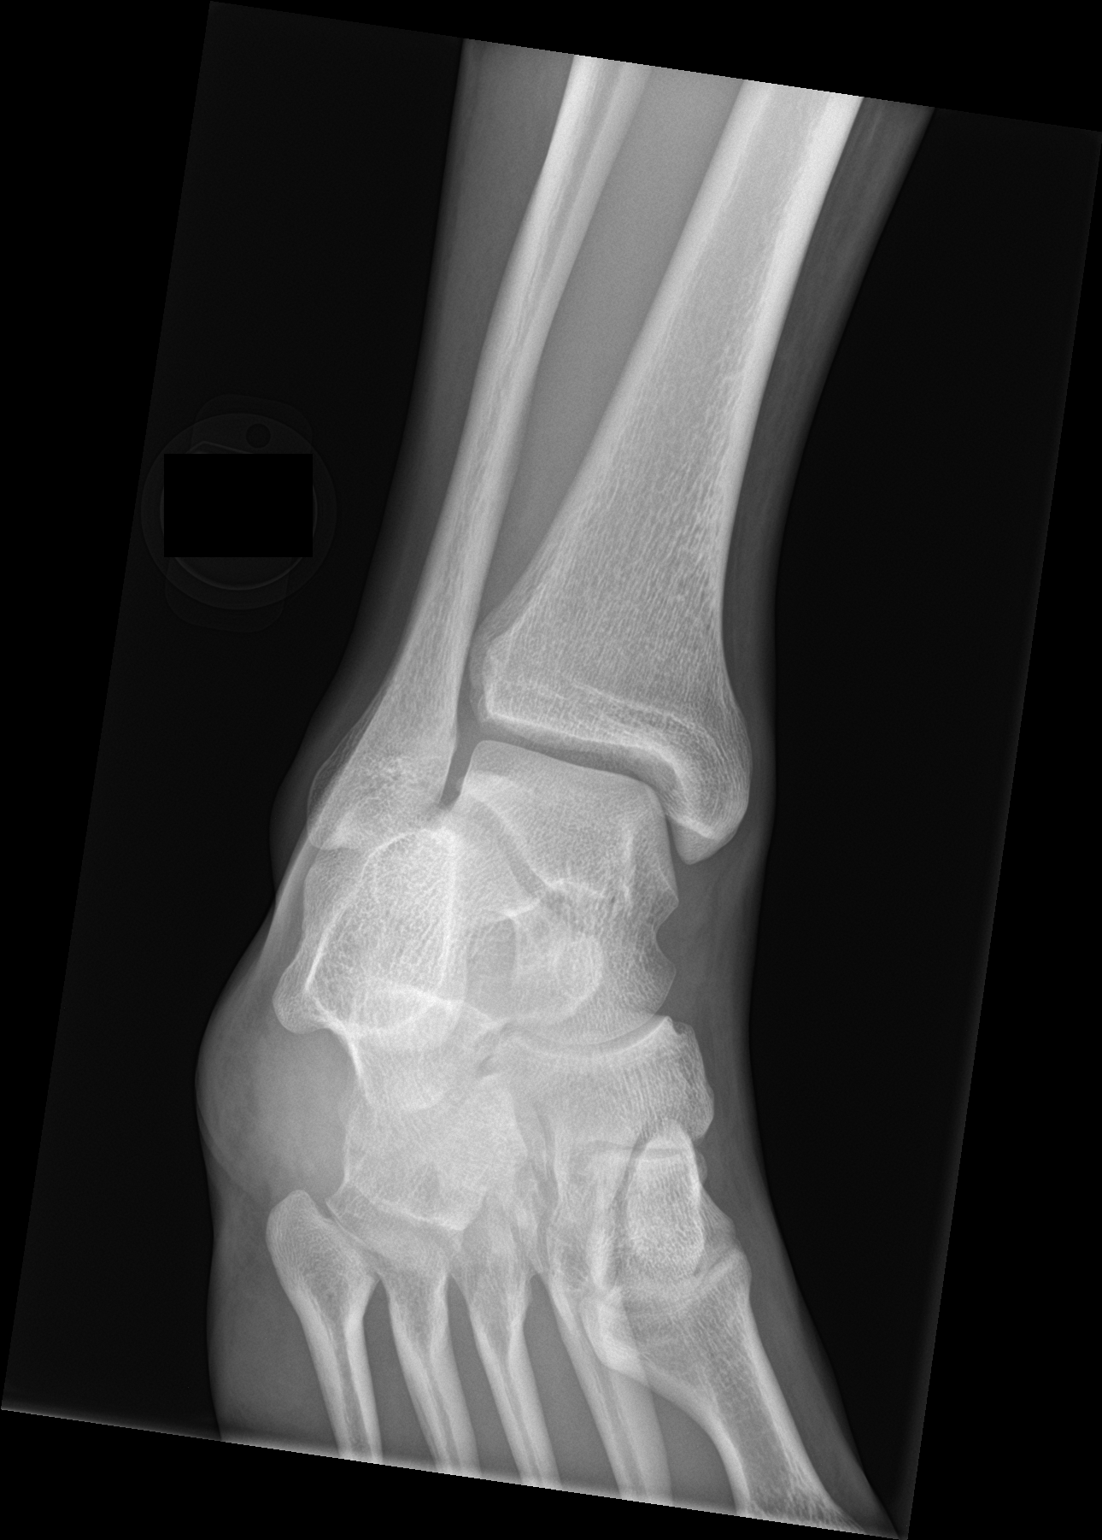

[ankle lat]
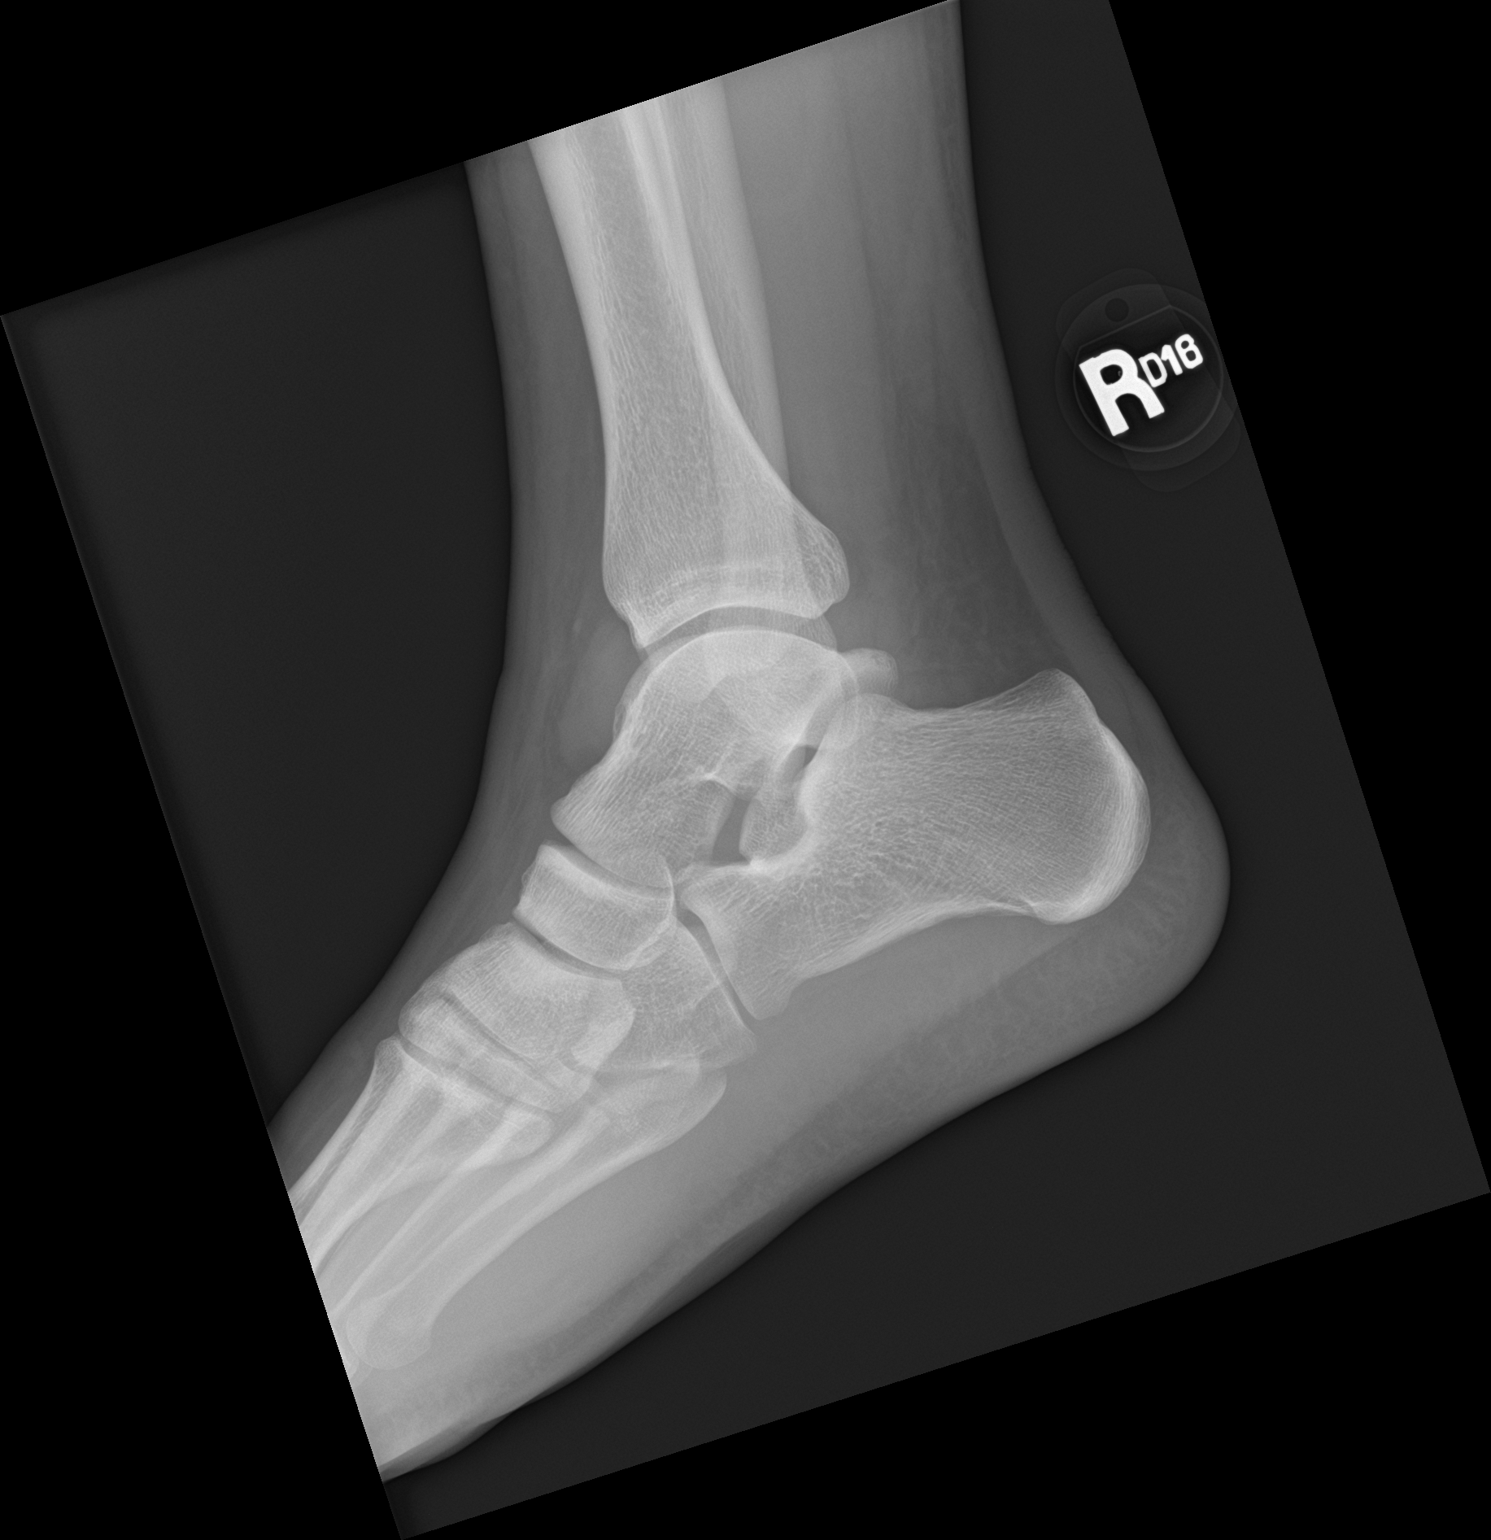

[3 of 3 positions shown; findings below may reference images not displayed]

FINDINGS: Right ankle joint effusion is evident on the lateral view.
Skeletally mature. Bone mineralization is within normal limits.
Preserved mortise joint alignment. Talar dome intact. Distal tibia,
fibula, and calcaneus appear intact. Other visible bones of the
right foot appear intact.
IMPRESSION: Right ankle joint effusion with no acute fracture or dislocation
identified.

## 2022-07-18 ENCOUNTER — Other Ambulatory Visit: Payer: Self-pay

## 2022-07-18 ENCOUNTER — Encounter: Payer: Self-pay | Admitting: Emergency Medicine

## 2022-07-18 ENCOUNTER — Emergency Department
Admission: EM | Admit: 2022-07-18 | Discharge: 2022-07-18 | Disposition: A | Discharge status: Home / Self Care | Payer: Medicaid Other | Attending: Emergency Medicine | Admitting: Emergency Medicine

## 2022-07-18 ENCOUNTER — Emergency Department: Payer: Medicaid Other

## 2022-07-18 DIAGNOSIS — Y92410 Unspecified street and highway as the place of occurrence of the external cause: Secondary | ICD-10-CM | POA: Insufficient documentation

## 2022-07-18 DIAGNOSIS — S0990XA Unspecified injury of head, initial encounter: Secondary | ICD-10-CM | POA: Insufficient documentation

## 2022-07-18 MED ORDER — BUTALBITAL-APAP-CAFFEINE 50-325-40 MG PO TABS: 1.0000 | ORAL_TABLET | Freq: Four times a day (QID) | ORAL | 0 refills | Status: AC | PRN
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
## 2023-08-06 ENCOUNTER — Encounter: Payer: Self-pay | Admitting: Emergency Medicine

## 2023-08-06 ENCOUNTER — Emergency Department

## 2023-08-06 ENCOUNTER — Other Ambulatory Visit: Payer: Self-pay

## 2023-08-06 ENCOUNTER — Emergency Department
Admission: EM | Admit: 2023-08-06 | Discharge: 2023-08-06 | Disposition: A | Discharge status: Home / Self Care | Attending: Emergency Medicine | Admitting: Emergency Medicine

## 2023-08-06 DIAGNOSIS — M62838 Other muscle spasm: Secondary | ICD-10-CM | POA: Insufficient documentation

## 2023-08-06 DIAGNOSIS — M436 Torticollis: Secondary | ICD-10-CM | POA: Diagnosis present

## 2023-08-06 MED ORDER — METHOCARBAMOL 1000 MG/10ML IJ SOLN
750.0000 mg | Freq: Once | INTRAMUSCULAR | Status: AC
  Administered 2023-08-06: 750 mg via INTRAVENOUS
  Filled 2023-08-06 (×2): qty 7.5

## 2023-08-06 MED ORDER — KETOROLAC TROMETHAMINE 15 MG/ML IJ SOLN
15.0000 mg | Freq: Once | INTRAMUSCULAR | Status: AC
  Administered 2023-08-06: 15 mg via INTRAVENOUS
  Filled 2023-08-06: qty 1

## 2023-08-06 MED ORDER — DEXAMETHASONE SODIUM PHOSPHATE 10 MG/ML IJ SOLN
10.0000 mg | Freq: Once | INTRAMUSCULAR | Status: AC
  Administered 2023-08-06: 10 mg via INTRAVENOUS
  Filled 2023-08-06: qty 1

## 2023-08-06 MED ORDER — SODIUM CHLORIDE 0.9 % IV BOLUS
1000.0000 mL | Freq: Once | INTRAVENOUS | Status: AC
  Administered 2023-08-06: 1000 mL via INTRAVENOUS

## 2023-08-06 MED ORDER — METHOCARBAMOL 500 MG PO TABS: 500.0000 mg | ORAL_TABLET | Freq: Four times a day (QID) | ORAL | 0 refills | Status: AC

## 2023-08-06 MED ORDER — MELOXICAM 15 MG PO TABS: 15.0000 mg | ORAL_TABLET | Freq: Every day | ORAL | 0 refills | Status: AC

## 2023-08-06 NOTE — ED Triage Notes (Signed)
Patient to ED via POV for neck pain. Pt states he woke up with a stiff neck- unable to move. Feels like spasms.

## 2023-08-06 NOTE — ED Notes (Signed)
ED Provider at bedside. 

## 2023-08-06 NOTE — Discharge Instructions (Addendum)
Please take medications as prescribed and perform gentle range of motion/stretching exercises.  Use a heating pad as needed.  Please follow-up with PCP for PT referral or recommend chiropractic care/massage therapy.  Return to the ER for any fevers, severe increasing pain, worsening symptoms or any urgent changes in her health.

## 2023-08-06 NOTE — ED Provider Notes (Signed)
[place] [facility] AT [facility] Provider Note   CSN: [id number] Arrival date & time: 08/06/23  1846     History  Chief Complaint  Patient presents with   Torticollis    Jerry Perez is a 22 y.o. male.  Presents to the emergency department evaluation of a left-sided cervical muscle strain/spasm.  He states he woke up this morning with severe tight spasm in his left trapezius muscle.  He denies any trauma or injury yesterday.  No change in activity.  He works out routinely and yesterday was his off day.  He lifts weights typically and play soccer.  Denies any changes to his regular routine.  He has not had any medications for his symptoms.  Pain is moderate.  Denies any fevers, body aches, chills, cold symptoms.  HPI     Home Medications Prior to Admission medications   Medication Sig Start Date End Date Taking? Authorizing Provider  meloxicam (MOBIC) 15 MG tablet Take 1 tablet (15 mg total) by mouth daily. 08/06/23  Yes Evon Slack, PA-C  methocarbamol (ROBAXIN) 500 MG tablet Take 1 tablet (500 mg total) by mouth 4 (four) times daily. 08/06/23  Yes Evon Slack, PA-C      Allergies    Patient has no known allergies.    Review of Systems   Review of Systems  Physical Exam Updated Vital Signs BP 115/75   Pulse (!) 55   Temp 98.5 F (36.9 C)   Resp 16   Ht 5\' 7"  (1.702 m)   Wt 74.8 kg   SpO2 100%   BMI 25.84 kg/m  Physical Exam Constitutional:      Appearance: He is well-developed.  HENT:     Head: Normocephalic and atraumatic.  Eyes:     Conjunctiva/sclera: Conjunctivae normal.  Cardiovascular:     Rate and Rhythm: Normal rate.  Pulmonary:     Effort: Pulmonary effort is normal. No respiratory distress.  Abdominal:     General: There is no distension.     Tenderness: There is no abdominal tenderness. There is no guarding.  Musculoskeletal:        General: Normal range of motion.     Cervical back: Normal range of motion.      Comments: No spinous process tenderness.  Left paravertebral muscle tenderness on the trapezium with muscle spasm noted.  He has limited neck range of motion secondary to pain and stiffness.  He is nontender on the right paravertebral muscles of the cervical spine.  No thoracic or lumbar spinous process tenderness.  There is no warmth or redness or bruising noted.  Skin:    General: Skin is warm.     Findings: No rash.  Neurological:     General: No focal deficit present.     Mental Status: He is alert and oriented to person, place, and time. Mental status is at baseline.     Cranial Nerves: No cranial nerve deficit.     Motor: No weakness.     Gait: Gait normal.  Psychiatric:        Behavior: Behavior normal.        Thought Content: Thought content normal.     ED Results / Procedures / Treatments   Labs (all labs ordered are listed, but only abnormal results are displayed) Labs Reviewed - No data to display  EKG None  Radiology DG Cervical Spine 2-3 Views  Result Date: 08/06/2023 CLINICAL DATA:  Neck pain EXAM: CERVICAL SPINE -  3 VIEW COMPARISON:  None Available. FINDINGS: Seven cervical segments are well visualized. Vertebral body height is well maintained. No soft tissue abnormality is noted. The odontoid is within normal limits. No acute fracture is noted. IMPRESSION: No acute abnormality noted. Electronically Signed   By: Alcide Clever M.D.   On: 08/06/2023 21:35    Procedures Procedures    Medications Ordered in ED Medications  sodium chloride 0.9 % bolus 1,000 mL (1,000 mLs Intravenous New Bag/Given 08/06/23 2147)  ketorolac (TORADOL) 15 MG/ML injection 15 mg (15 mg Intravenous Given 08/06/23 2147)  methocarbamol (ROBAXIN) injection 750 mg (750 mg Intravenous Given 08/06/23 2228)  dexamethasone (DECADRON) injection 10 mg (10 mg Intravenous Given 08/06/23 2300)    ED Course/ Medical Decision Making/ A&P                                 Medical Decision Making Amount  and/or Complexity of Data Reviewed Radiology: ordered.  Risk Prescription drug management.   22 year old with left paravertebral muscle spasm, began this morning upon awakening.  No numbness tingling or radicular symptoms.  No fevers chills or headaches.  Patient has focal tenderness and spasm to the left trapezius muscle.  This was significantly improved with Toradol, methocarbamol and dexamethasone.  Patient given strict return precautions and will continue with gentle stretching exercises and heat therapy.  He is given prescription for Mobic and methocarbamol to take at home.  He understands signs symptoms return to the ER for. Final Clinical Impression(s) / ED Diagnoses Final diagnoses:  Cervical paraspinal muscle spasm    Rx / DC Orders ED Discharge Orders          Ordered    meloxicam (MOBIC) 15 MG tablet  Daily        08/06/23 2314    methocarbamol (ROBAXIN) 500 MG tablet  4 times daily        08/06/23 2314              Ronnette Juniper 08/06/23 2317    Jene Every, MD 08/09/23 [phone]

## 2023-09-12 ENCOUNTER — Ambulatory Visit
Admission: RE | Admit: 2023-09-12 | Discharge: 2023-09-12 | Disposition: A | Discharge status: Home / Self Care | Source: Ambulatory Visit | Attending: Emergency Medicine | Admitting: Emergency Medicine

## 2023-09-12 VITALS — BP 122/77 | HR 57 | Temp 98.0°F | Resp 18

## 2023-09-12 DIAGNOSIS — H00011 Hordeolum externum right upper eyelid: Secondary | ICD-10-CM | POA: Diagnosis not present

## 2023-09-12 MED ORDER — AMOXICILLIN-POT CLAVULANATE 875-125 MG PO TABS: 1.0000 | ORAL_TABLET | Freq: Two times a day (BID) | ORAL | 0 refills | Status: AC

## 2023-09-12 NOTE — ED Triage Notes (Signed)
Patient to Urgent Care with complaints of a "pea sized bump" on his right eye.   Reports area appeared approx one month ago but pain has worsened and it has increased in size.  No drainage.

## 2023-09-12 NOTE — ED Provider Notes (Signed)
[facility]    CSN: [id number] Arrival date & time: 09/12/23  0347      History   Chief Complaint Chief Complaint  Patient presents with   Eye Problem    I have a pea sized bump on my eye that has been there over a month and is hurting more as time goes on. - Entered by patient    HPI Jerry Perez is a 22 y.o. male.  Patient presents with a painful red bump on his right upper eyelid x 1 month.  The bump has gotten worse in the last week and has developed a pustule.  Patient has been treating it with warm compresses.  No eye trauma, eye drainage, eye pain (other than the bump on the eyelid), change in vision, fever, chills, or other symptoms.  He wears glasses.  Last eye exam 1 year ago.  No other pertinent medical history.  The history is provided by the patient and medical records.    History reviewed. No pertinent past medical history.  There are no problems to display for this patient.   History reviewed. No pertinent surgical history.     Home Medications    Prior to Admission medications   Medication Sig Start Date End Date Taking? Authorizing Provider  amoxicillin-clavulanate (AUGMENTIN) 875-125 MG tablet Take 1 tablet by mouth every 12 (twelve) hours. 09/12/23  Yes Mickie Bail, NP  meloxicam (MOBIC) 15 MG tablet Take 1 tablet (15 mg total) by mouth daily. Patient not taking: Reported on 09/12/2023 08/06/23   Evon Slack, PA-C  methocarbamol (ROBAXIN) 500 MG tablet Take 1 tablet (500 mg total) by mouth 4 (four) times daily. Patient not taking: Reported on 09/12/2023 08/06/23   Ronnette Juniper    Family History History reviewed. No pertinent family history.  Social History Social History   Tobacco Use   Smoking status: Never   Smokeless tobacco: Never  Substance Use Topics   Alcohol use: Yes    Comment: rarely   Drug use: Never     Allergies   Patient has no known allergies.   Review of Systems Review of Systems   Constitutional:  Negative for chills and fever.  Eyes:  Positive for redness. Negative for pain, discharge, itching and visual disturbance.       Bump on eyelid     Physical Exam Triage Vital Signs ED Triage Vitals [09/12/23 0943]  Encounter Vitals Group     BP      Systolic BP Percentile      Diastolic BP Percentile      Pulse Rate (!) 57     Resp 18     Temp 98 F (36.7 C)     Temp src      SpO2 98 %     Weight      Height      Head Circumference      Peak Flow      Pain Score      Pain Loc      Pain Education      Exclude from Growth Chart    No data found.  Updated Vital Signs BP 122/77   Pulse (!) 57   Temp 98 F (36.7 C)   Resp 18   SpO2 98%   Visual Acuity Right Eye Distance: 20/20 Left Eye Distance: 20/20 Bilateral Distance: 20/20  Right Eye Near:   Left Eye Near:    Bilateral Near:  Physical Exam Constitutional:      General: He is not in acute distress. HENT:     Mouth/Throat:     Mouth: Mucous membranes are moist.  Eyes:     General: Vision grossly intact.        Right eye: No discharge.        Left eye: No discharge.     Extraocular Movements: Extraocular movements intact.     Conjunctiva/sclera: Conjunctivae normal.     Right eye: Right conjunctiva is not injected.     Left eye: Left conjunctiva is not injected.     Pupils: Pupils are equal, round, and reactive to light.   Cardiovascular:     Rate and Rhythm: Normal rate and regular rhythm.  Pulmonary:     Effort: Pulmonary effort is normal. No respiratory distress.  Skin:    General: Skin is warm and dry.  Neurological:     Mental Status: He is alert.  Psychiatric:        Mood and Affect: Mood normal.        Behavior: Behavior normal.      UC Treatments / Results  Labs (all labs ordered are listed, but only abnormal results are displayed) Labs Reviewed - No data to display  EKG   Radiology No results found.  Procedures Procedures (including critical care  time)  Medications Ordered in UC Medications - No data to display  Initial Impression / Assessment and Plan / UC Course  I have reviewed the triage vital signs and the nursing notes.  Pertinent labs & imaging results that were available during my care of the patient were reviewed by me and considered in my medical decision making (see chart for details).    Right upper eyelid hordeolum.  The stye has been present for 1 month and has gotten worse in the past week.  Afebrile and vital signs are stable.  Treating with warm compresses and Augmentin.  Instructed patient to follow-up with an ophthalmologist.  Contact information for on-call ophthalmologist provided.  ED precautions given.  Patient agrees to plan of care.  Final Clinical Impressions(s) / UC Diagnoses   Final diagnoses:  Hordeolum externum of right upper eyelid     Discharge Instructions      Apply warm compresses.  Take the Augmentin as directed.  Follow-up with an ophthalmologist such as the one listed below.     ED Prescriptions     Medication Sig Dispense Auth. Provider   amoxicillin-clavulanate (AUGMENTIN) 875-125 MG tablet Take 1 tablet by mouth every 12 (twelve) hours. 14 tablet Mickie Bail, NP      PDMP not reviewed this encounter.   Mickie Bail, NP 09/12/23 1028

## 2023-09-12 NOTE — Discharge Instructions (Addendum)
Apply warm compresses.  Take the Augmentin as directed.  Follow-up with an ophthalmologist such as the one listed below.

## 2024-10-26 ENCOUNTER — Emergency Department

## 2024-10-26 ENCOUNTER — Other Ambulatory Visit: Payer: Self-pay

## 2024-10-26 ENCOUNTER — Encounter: Payer: Self-pay | Admitting: Emergency Medicine

## 2024-10-26 ENCOUNTER — Emergency Department
Admission: EM | Admit: 2024-10-26 | Discharge: 2024-10-26 | Disposition: A | Discharge status: Home / Self Care | Attending: Emergency Medicine | Admitting: Emergency Medicine

## 2024-10-26 DIAGNOSIS — S0181XA Laceration without foreign body of other part of head, initial encounter: Secondary | ICD-10-CM | POA: Diagnosis not present

## 2024-10-26 DIAGNOSIS — W2203XA Walked into furniture, initial encounter: Secondary | ICD-10-CM | POA: Diagnosis not present

## 2024-10-26 DIAGNOSIS — R519 Headache, unspecified: Secondary | ICD-10-CM | POA: Diagnosis present

## 2024-10-26 DIAGNOSIS — S060X0A Concussion without loss of consciousness, initial encounter: Secondary | ICD-10-CM | POA: Diagnosis not present

## 2024-10-26 DIAGNOSIS — S0990XA Unspecified injury of head, initial encounter: Secondary | ICD-10-CM

## 2024-10-26 MED ORDER — ACETAMINOPHEN 325 MG PO TABS
650.0000 mg | ORAL_TABLET | Freq: Once | ORAL | Status: AC
  Administered 2024-10-26: 650 mg via ORAL
  Filled 2024-10-26: qty 2

## 2024-10-26 MED ORDER — ACETAMINOPHEN ER 650 MG PO TBCR: 650.0000 mg | EXTENDED_RELEASE_TABLET | Freq: Three times a day (TID) | ORAL | 0 refills | Status: AC | PRN

## 2024-10-26 NOTE — ED Triage Notes (Signed)
 Pt reports moving a dresser a few hours ago and reports he hit his head on the corner of it. Denies LOC. Reports some nausea & light sensitivity.

## 2024-10-26 NOTE — ED Provider Notes (Signed)
 [facility] Provider Note    Event Date/Time   First MD Initiated Contact with Patient 10/26/24 1929     (approximate)   History   Headache    HPI  Jerry Perez is a 23 y.o. male   with no significant past medical history who presents to the ED complaining of  head trauma . According to the patient, today he was moving furniture and hit his head with a corner of the dresser.  Patient denied loss of consciousness, blurry vision, vomiting.  Patient reports having nauseas, photophobia, cephalalgia in the forehead, 8/10.     There are no active problems to display for this patient.    Physical Exam   Triage Vital Signs: ED Triage Vitals  Encounter Vitals Group     BP 10/26/24 1924 130/76     Girls Systolic BP Percentile --      Girls Diastolic BP Percentile --      Boys Systolic BP Percentile --      Boys Diastolic BP Percentile --      Pulse Rate 10/26/24 1924 87     Resp 10/26/24 1924 18     Temp 10/26/24 1924 98.7 F (37.1 C)     Temp Source 10/26/24 1924 Oral     SpO2 10/26/24 1924 98 %     Weight --      Height --      Head Circumference --      Peak Flow --      Pain Score 10/26/24 1925 8     Pain Loc --      Pain Education --      Exclude from Growth Chart --     Most recent vital signs: Vitals:   10/26/24 1924  BP: 130/76  Pulse: 87  Resp: 18  Temp: 98.7 F (37.1 C)  SpO2: 98%     Physical Exam Vitals and nursing note reviewed.  During triage vital signs were normal General:          Awake, no distress.  Head: Left upper forehead presents of punctuated  laceration 2mm.  No active bleeding.  Presence of ecchymosis, tender to palpation, no depressions. Neck: Supple, full ROM, no tenderness to palpation in the midline of the cervical process, no tenderness in the paraspinal process. Eyes: PERRLA, EOM without tenderness.  Photophobia CV:                  Good peripheral perfusion.  Resp:               Normal effort. no  tachypnea Abd:                 No distention.  Soft nontender Other:               Neurological: GCS 15 alert and oriented x3 Normal speech, no expressive or receptive aphasia or dysarthria Cranial nerves II through XII intact Normal visual fields 5 out of 5 strength in all 4 extremities with intact sensation throughout No extremity drift  ED Results / Procedures / Treatments   Labs (all labs ordered are listed, but only abnormal results are displayed) Labs Reviewed - No data to display  RADIOLOGY I independently reviewed and interpreted imaging and agree with radiologists findings.      PROCEDURES:  Critical Care performed:   Procedures   MEDICATIONS ORDERED IN ED: Medications  acetaminophen  (TYLENOL ) tablet 650 mg (650 mg Oral Given 10/26/24 2014)  Clinical Course as of 10/26/24 2039  Sat Oct 26, 2024  2030 CT Head Wo Contrast Stable head CT, no acute intracranial process. [AE]  2030 Assessed the patient, updated patient with results of the CT, patient is going to be discharged with acetaminophen .  Patient is agreeable with the plan [AE]    Clinical Course User Index [AE] Janit Kast, PA-C    IMPRESSION / MDM / ASSESSMENT AND PLAN / ED COURSE  I reviewed the triage vital signs and the nursing notes.  Differential diagnosis includes, but is not limited to, head injury, concussion, intracranial hemorrhage, fracture of the skull.  Patient's presentation is most consistent with acute complicated illness / injury requiring diagnostic workup.   Jerry Perez is a 23 y.o., male who presents today after having a head trauma when he hit his left side of the head with a furniture.  Patient denies loss of consciousness, blurry vision, and vomiting.  Patient endorses having photophobia, nauseous, frontal headache.  On a physical exam patient vital signs are normal, patient is oriented to time.  There is a bump to her left laceration in the left side of the upper  forehead.  No active bleeding.  Ecchymosis about 3 cm, tender to palpation, no depression.  Neurological exam without focal deficits.  Rest of the physical exam is normal. Plan CT of the head without contrast Acetaminophen  Reassess Reassessed the patient, CT of the head was negative for intracranial hemorrhage or acute pathology.  I did explain the patient that his symptoms are expected as a concussion.  I did explain symptoms.  Patient works for [employer] diagnosis is consistent with head trauma, concussion. I independently reviewed and interpreted imaging and agree with radiologists findings.  Did not order any labs, physical exam was reassuring. I did review the patient's allergies and medications.The patient is in stable and satisfactory condition for discharge home  Patient will be discharged home with prescriptions for acetaminophen  650 mg. Patient is to follow up with PCP as needed or otherwise directed. Patient is given ED precautions to return to the ED for any worsening or new symptoms.  Work note will be provided Discussed plan of care with patient, answered all of patient's questions, Patient agreeable to plan of care. Advised patient to take medications according to the instructions on the label. Discussed possible side effects of new medications. Patient verbalized understanding.  FINAL CLINICAL IMPRESSION(S) / ED DIAGNOSES   Final diagnoses:  Injury of head, initial encounter  Concussion without loss of consciousness, initial encounter     Rx / DC Orders   ED Discharge Orders          Ordered    acetaminophen  (ACETAMINOPHEN  8 HOUR) 650 MG CR tablet  Every 8 hours PRN        10/26/24 2037             Note:  This document was prepared using Dragon voice recognition software and may include unintentional dictation errors.   Janit Kast, PA-C 10/26/24 2039    Jacolyn Pae, MD 10/26/24 2258

## 2024-10-26 NOTE — Discharge Instructions (Signed)
 You are being diagnosed with head injury, concussion.  Please take acetaminophen  every 8 hours as needed for pain.  Please drink plenty of fluids.Follow these instructions at home: Activity Limit activities that need a lot of thought or focus, such as: Homework or work for your job. Watching TV. Using the computer or phone. Playing memory games and puzzles. Get rest because this helps your brain heal. Make sure you: Get plenty of sleep. Most adults should get 7-9 hours of sleep each night. Rest during the day. Take naps or breaks when you feel tired. Avoid activity or exercise that takes a lot of effort until your doctor says it is safe. Stop any activity that makes symptoms worse. Your doctor may tell you to do light exercise like walking. Do not do activities that could cause a second concussion, such as riding a bike or playing sports. Ask your doctor when you can return to your normal activities, such as school, work, sports, and driving. Your ability to react may be slower. Do not do these activities if you are dizzy. Please come back to ED with your PCP if you have new symptoms or symptoms worsen.  It was a pleasure to help you today.  Nayan Proch PA-C
